# Patient Record
Sex: Female | Born: 1995 | Race: Black or African American | Hispanic: No | Marital: Single | Smoking: Never smoker
Health system: Southern US, Community
[De-identification: ages and names within clinical notes are randomized; demographics above are authoritative.]

## PROBLEM LIST (undated history)

## (undated) DIAGNOSIS — G43909 Migraine, unspecified, not intractable, without status migrainosus: Secondary | ICD-10-CM

---
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
## 2019-04-28 ENCOUNTER — Ambulatory Visit (HOSPITAL_COMMUNITY)
Admission: EM | Admit: 2019-04-28 | Discharge: 2019-04-28 | Disposition: A | Discharge status: Home / Self Care | Payer: HRSA Program | Attending: Family Medicine | Admitting: Family Medicine

## 2019-04-28 ENCOUNTER — Encounter (HOSPITAL_COMMUNITY): Payer: Self-pay

## 2019-04-28 ENCOUNTER — Other Ambulatory Visit: Payer: Self-pay

## 2019-04-28 DIAGNOSIS — R112 Nausea with vomiting, unspecified: Secondary | ICD-10-CM | POA: Diagnosis present

## 2019-04-28 DIAGNOSIS — Z20822 Contact with and (suspected) exposure to covid-19: Secondary | ICD-10-CM | POA: Insufficient documentation

## 2019-04-28 DIAGNOSIS — A084 Viral intestinal infection, unspecified: Secondary | ICD-10-CM | POA: Insufficient documentation

## 2019-04-28 MED ORDER — ONDANSETRON HCL 4 MG PO TABS: 4.0000 mg | ORAL_TABLET | Freq: Four times a day (QID) | ORAL | 0 refills | Status: DC

## 2019-04-28 MED ORDER — ONDANSETRON 4 MG PO TBDP
ORAL_TABLET | ORAL | Status: AC
  Filled 2019-04-28: qty 2

## 2019-04-28 MED ORDER — ONDANSETRON 4 MG PO TBDP
8.0000 mg | ORAL_TABLET | Freq: Once | ORAL | Status: AC
  Administered 2019-04-28: 8 mg via ORAL

## 2019-04-28 NOTE — ED Triage Notes (Signed)
Pt states woke up at 0600 this morning and had diarrhea and vomiting. Pt states had 8 episodes of emesis and diarrhea 5 episodes today.

## 2019-04-28 NOTE — Discharge Instructions (Signed)
Take the zofran as needed Clear liquids at first then advance to solid bland foods May take imodium or peptobismol if needed for diarrhea Should improve in a couple of days

## 2019-04-28 NOTE — ED Provider Notes (Signed)
[facility]    CSN: [id number] Arrival date & time: 04/28/19  1230      History   Chief Complaint Chief Complaint  Patient presents with  . Emesis    HPI Debbie Yoder is a 24 y.o. female.   HPI  Nausea vomiting diarrhea starting this morning Is not able to control her vomiting.  Cannot keep down even water Crampy abdominal pain that comes and goes.  Has cramps every time she has diarrhea Watery diarrhea with no blood or mucus  no recent travel no known exposure to illness h No lactose intolerance or food intolerance known [place] on campus, exposed to other kids in college  History reviewed. No pertinent past medical history.  There are no problems to display for this patient.   History reviewed. No pertinent surgical history.  OB History   No obstetric history on file.      Home Medications    Prior to Admission medications   Medication Sig Start Date End Date Taking? Authorizing Provider  ondansetron (ZOFRAN) 4 MG tablet Take 1-2 tablets (4-8 mg total) by mouth every 6 (six) hours. 04/28/19   Raylene Everts, MD    Family History Family History  Problem Relation Age of Onset  . Hypertension Mother     Social History Social History   Tobacco Use  . Smoking status: Never Smoker  . Smokeless tobacco: Never Used  Substance Use Topics  . Alcohol use: Yes    Comment: occ  . Drug use: Never     Allergies   Patient has no known allergies.   Review of Systems Review of Systems  Constitutional: Positive for chills and fever.  Gastrointestinal: Positive for abdominal pain, diarrhea, nausea and vomiting.  Neurological: Negative for headaches.     Physical Exam Triage Vital Signs ED Triage Vitals  Enc Vitals Group     BP 04/28/19 1401 131/75     Pulse Rate 04/28/19 1401 (!) 103     Resp 04/28/19 1401 18     Temp 04/28/19 1401 98.1 F (36.7 C)     Temp Source 04/28/19 1401 Oral     SpO2 04/28/19 1401 99 %     Weight 04/28/19 1402  180 lb (81.6 kg)     Height 04/28/19 1402 4\' 11"  (1.499 m)     Head Circumference --      Peak Flow --      Pain Score 04/28/19 1402 7     Pain Loc --      Pain Edu? --      Excl. in [place]? --    No data found.  Updated Vital Signs BP 131/75   Pulse (!) 103   Temp 98.1 F (36.7 C) (Oral)   Resp 18   Ht 4\' 11"  (1.499 m)   Wt 81.6 kg   SpO2 99%   BMI 36.36 kg/m      Physical Exam Constitutional:      General: She is not in acute distress.    Appearance: She is well-developed.     Comments: overweight  HENT:     Head: Normocephalic and atraumatic.     Mouth/Throat:     Mouth: Mucous membranes are moist.     Comments: mask Eyes:     Conjunctiva/sclera: Conjunctivae normal.     Pupils: Pupils are equal, round, and reactive to light.  Cardiovascular:     Rate and Rhythm: Normal rate and regular rhythm.  Pulmonary:  Effort: Pulmonary effort is normal. No respiratory distress.     Breath sounds: Normal breath sounds.  Abdominal:     General: There is no distension.     Palpations: Abdomen is soft.     Tenderness: There is no abdominal tenderness.     Comments: Active BS  Musculoskeletal:        General: Normal range of motion.     Cervical back: Normal range of motion.  Lymphadenopathy:     Cervical: No cervical adenopathy.  Skin:    General: Skin is warm and dry.  Neurological:     Mental Status: She is alert.  Psychiatric:        Mood and Affect: Mood normal.        Behavior: Behavior normal.      UC Treatments / Results  Labs (all labs ordered are listed, but only abnormal results are displayed) Labs Reviewed  SARS CORONAVIRUS 2 (TAT 6-24 HRS)    EKG   Radiology No results found.  Procedures Procedures (including critical care time)  Medications Ordered in UC Medications  ondansetron (ZOFRAN-ODT) disintegrating tablet 8 mg (8 mg Oral Given 04/28/19 1438)    Initial Impression / Assessment and Plan / UC Course  I have reviewed the triage  vital signs and the nursing notes.  Pertinent labs & imaging results that were available during my care of the patient were reviewed by me and considered in my medical decision making (see chart for details).     After Zofran I went in to check patient.  She was asleep on the table.  She was discharged home with instructions regarding prevention of dehydration, advancement of diet, home treatment of gastroenteritis Final Clinical Impressions(s) / UC Diagnoses   Final diagnoses:  Viral gastroenteritis     Discharge Instructions     Take the zofran as needed Clear liquids at first then advance to solid bland foods May take imodium or peptobismol if needed for diarrhea Should improve in a couple of days    ED Prescriptions    Medication Sig Dispense Auth. Provider   ondansetron (ZOFRAN) 4 MG tablet Take 1-2 tablets (4-8 mg total) by mouth every 6 (six) hours. 12 tablet Eustace Moore, MD     PDMP not reviewed this encounter.   Eustace Moore, MD 04/28/19 [phone]

## 2019-04-29 LAB — SARS CORONAVIRUS 2 (TAT 6-24 HRS): SARS Coronavirus 2: NEGATIVE

## 2020-01-09 ENCOUNTER — Encounter (HOSPITAL_COMMUNITY): Payer: Self-pay | Admitting: Emergency Medicine

## 2020-01-09 ENCOUNTER — Other Ambulatory Visit: Payer: Self-pay

## 2020-01-09 ENCOUNTER — Ambulatory Visit (HOSPITAL_COMMUNITY)
Admission: EM | Admit: 2020-01-09 | Discharge: 2020-01-09 | Disposition: A | Discharge status: Home / Self Care | Payer: 59 | Attending: Emergency Medicine | Admitting: Emergency Medicine

## 2020-01-09 DIAGNOSIS — J069 Acute upper respiratory infection, unspecified: Secondary | ICD-10-CM | POA: Insufficient documentation

## 2020-01-09 DIAGNOSIS — U071 COVID-19: Secondary | ICD-10-CM | POA: Diagnosis not present

## 2020-01-09 HISTORY — DX: Migraine, unspecified, not intractable, without status migrainosus: G43.909

## 2020-01-09 LAB — RESP PANEL BY RT-PCR (FLU A&B, COVID) ARPGX2
Influenza A by PCR: NEGATIVE
Influenza B by PCR: NEGATIVE
SARS Coronavirus 2 by RT PCR: POSITIVE — AB

## 2020-01-09 MED ORDER — PROMETHAZINE-DM 6.25-15 MG/5ML PO SYRP: 5.0000 mL | ORAL_SOLUTION | Freq: Four times a day (QID) | ORAL | 0 refills | Status: DC | PRN

## 2020-01-09 MED ORDER — BENZONATATE 100 MG PO CAPS: 200.0000 mg | ORAL_CAPSULE | Freq: Three times a day (TID) | ORAL | 0 refills | Status: DC | PRN

## 2020-01-09 NOTE — Discharge Instructions (Addendum)
Isolate at home to the results of your Covid test are back.  If your Covid positive you will need to quarantine for 10 days from the start of your symptoms.  After 10 days you can break quarantine if your symptoms have improved and you are not running a fever in 24 hours.  Use the Tessalon Perles every 8 hours as needed for cough.  Take them with a small sip of water.  They may give you a metallic taste in your mouth or some numbness the patient returned that is normal. Inability to speak full sentences, or blowing around her lips you to go to the ER for evaluation. Use the Promethazine DM at bedtime for cough, congestion, nausea, and sleep.  If you develop worsening shortness of breath, especially shortness of breath at rest,

## 2020-01-09 NOTE — ED Triage Notes (Signed)
Pt complains of headache, stomach ache, vomiting and fever x 3 days. She has taken Acetaminophen and Dayquil today with minimal relief. Vomited x 1 within 24 hours. No diarrhea.

## 2020-01-09 NOTE — ED Provider Notes (Signed)
[facility]    CSN: [id number] Arrival date & time: 01/09/20  1817      History   Chief Complaint No chief complaint on file.   HPI Debbie Yoder is a 24 y.o. female.   HPI   7 old female here for evaluation of headache, fever, abdominal pain, vomiting x1.  Her symptoms have been present for last 3 days.  She states that she is also had a runny nose, sore throat, dry cough, mild which of breath, low abdominal pain.  Patient denies ear pain or pressure, sinus pain or pressure, wheezing, or diarrhea.  Patient is unaware of any sick contacts but she is also not been vaccinated against flu or Covid.  Past Medical History:  Diagnosis Date  . Migraine     There are no problems to display for this patient.   History reviewed. No pertinent surgical history.  OB History   No obstetric history on file.      Home Medications    Prior to Admission medications   Medication Sig Start Date End Date Taking? Authorizing Provider  benzonatate (TESSALON) 100 MG capsule Take 2 capsules (200 mg total) by mouth 3 (three) times daily as needed for cough. 01/09/20   Becky Augusta, NP  promethazine-dextromethorphan (PROMETHAZINE-DM) 6.25-15 MG/5ML syrup Take 5 mLs by mouth 4 (four) times daily as needed for cough. 01/09/20   Becky Augusta, NP    Family History Family History  Problem Relation Age of Onset  . Hypertension Mother     Social History Social History   Tobacco Use  . Smoking status: Never Smoker  . Smokeless tobacco: Never Used  Vaping Use  . Vaping Use: Never used  Substance Use Topics  . Alcohol use: Yes    Comment: occ  . Drug use: Never     Allergies   Patient has no known allergies.   Review of Systems Review of Systems  Constitutional: Positive for appetite change and fever. Negative for activity change.  HENT: Positive for congestion and sore throat. Negative for ear pain, rhinorrhea, sinus pressure and sinus pain.   Respiratory:  Positive for cough and shortness of breath. Negative for wheezing.   Cardiovascular: Negative for chest pain.  Gastrointestinal: Positive for abdominal pain, nausea and vomiting. Negative for constipation and diarrhea.  Musculoskeletal: Negative for arthralgias and myalgias.  Skin: Negative for rash.  Neurological: Positive for headaches. Negative for syncope.  Hematological: Negative.      Physical Exam Triage Vital Signs ED Triage Vitals [01/09/20 1909]  Enc Vitals Group     BP      Pulse      Resp      Temp      Temp src      SpO2      Weight      Height      Head Circumference      Peak Flow      Pain Score 0     Pain Loc      Pain Edu?      Excl. in [place]?    No data found.  Updated Vital Signs LMP 12/30/2019   Visual Acuity Right Eye Distance:   Left Eye Distance:   Bilateral Distance:    Right Eye Near:   Left Eye Near:    Bilateral Near:     Physical Exam Vitals and nursing note reviewed.  Constitutional:      General: She is not in acute distress.  Appearance: Normal appearance. She is normal weight. She is ill-appearing.  HENT:     Head: Normocephalic and atraumatic.     Right Ear: Tympanic membrane, ear canal and external ear normal. There is no impacted cerumen.     Left Ear: Tympanic membrane, ear canal and external ear normal. There is no impacted cerumen.     Nose: Congestion and rhinorrhea present.     Mouth/Throat:     Mouth: Mucous membranes are moist.     Pharynx: Oropharynx is clear. No oropharyngeal exudate or posterior oropharyngeal erythema.  Cardiovascular:     Rate and Rhythm: Normal rate and regular rhythm.     Pulses: Normal pulses.     Heart sounds: Normal heart sounds. No murmur heard. No gallop.   Pulmonary:     Effort: Pulmonary effort is normal.     Breath sounds: Normal breath sounds. No wheezing, rhonchi or rales.  Abdominal:     General: Bowel sounds are normal.     Palpations: Abdomen is soft.     Tenderness: There  is abdominal tenderness. There is no guarding or rebound.  Musculoskeletal:     Cervical back: Normal range of motion and neck supple.  Lymphadenopathy:     Cervical: No cervical adenopathy.  Skin:    General: Skin is warm and dry.     Capillary Refill: Capillary refill takes less than 2 seconds.     Findings: No erythema or rash.  Neurological:     General: No focal deficit present.     Mental Status: She is alert and oriented to person, place, and time.  Psychiatric:        Mood and Affect: Mood normal.        Behavior: Behavior normal.        Thought Content: Thought content normal.        Judgment: Judgment normal.      UC Treatments / Results  Labs (all labs ordered are listed, but only abnormal results are displayed) Labs Reviewed  RESP PANEL BY RT-PCR (FLU A&B, COVID) [id number]    EKG   Radiology No results found.  Procedures Procedures (including critical care time)  Medications Ordered in UC Medications - No data to display  Initial Impression / Assessment and Plan / UC Course  I have reviewed the triage vital signs and the nursing notes.  Pertinent labs & imaging results that were available during my care of the patient were reviewed by me and considered in my medical decision making (see chart for details).   Is here for evaluation of headache, fever, generalized abdominal pain, runny nose, sore throat, and one episode of vomiting.  Physical exam reveals marked erythema and edema of nasal mucosa with clear nasal discharge.  Posterior oropharynx is unremarkable.  Lungs are clear to auscultation.  Patient has some mild left lower quadrant tenderness without guarding.  Lungs are clear to auscultation.  Patient has not been vaccinated as Covid no or of the flu.  Will send respiratory triplex panel and discharge patient home to isolate until the Covid results are back.  Will give Tessalon Perles and Promethazine DM for cough and congestion.   Final Clinical  Impressions(s) / UC Diagnoses   Final diagnoses:  Acute upper respiratory infection     Discharge Instructions     Isolate at home to the results of your Covid test are back.  If your Covid positive you will need to quarantine for 10 days from the start of  your symptoms.  After 10 days you can break quarantine if your symptoms have improved and you are not running a fever in 24 hours.  Use the Tessalon Perles every 8 hours as needed for cough.  Take them with a small sip of water.  They may give you a metallic taste in your mouth or some numbness the patient returned that is normal. Inability to speak full sentences, or blowing around her lips you to go to the ER for evaluation. Use the Promethazine DM at bedtime for cough, congestion, nausea, and sleep.  If you develop worsening shortness of breath, especially shortness of breath at rest,    ED Prescriptions    Medication Sig Dispense Auth. Provider   benzonatate (TESSALON) 100 MG capsule Take 2 capsules (200 mg total) by mouth 3 (three) times daily as needed for cough. 21 capsule Becky Augusta, NP   promethazine-dextromethorphan (PROMETHAZINE-DM) 6.25-15 MG/5ML syrup Take 5 mLs by mouth 4 (four) times daily as needed for cough. 118 mL Becky Augusta, NP     PDMP not reviewed this encounter.   Becky Augusta, NP 01/09/20 1956

## 2020-01-11 ENCOUNTER — Emergency Department (HOSPITAL_COMMUNITY)
Admission: EM | Admit: 2020-01-11 | Discharge: 2020-01-11 | Disposition: A | Discharge status: Home / Self Care | Payer: 59 | Attending: Emergency Medicine | Admitting: Emergency Medicine

## 2020-01-11 ENCOUNTER — Other Ambulatory Visit: Payer: Self-pay

## 2020-01-11 ENCOUNTER — Telehealth: Payer: Self-pay | Admitting: Physician Assistant

## 2020-01-11 ENCOUNTER — Emergency Department (HOSPITAL_COMMUNITY): Payer: 59

## 2020-01-11 ENCOUNTER — Encounter (HOSPITAL_COMMUNITY): Payer: Self-pay | Admitting: Emergency Medicine

## 2020-01-11 ENCOUNTER — Encounter: Payer: Self-pay | Admitting: Oncology

## 2020-01-11 ENCOUNTER — Other Ambulatory Visit: Payer: Self-pay | Admitting: Oncology

## 2020-01-11 DIAGNOSIS — R070 Pain in throat: Secondary | ICD-10-CM | POA: Diagnosis present

## 2020-01-11 DIAGNOSIS — U071 COVID-19: Secondary | ICD-10-CM | POA: Diagnosis not present

## 2020-01-11 LAB — BASIC METABOLIC PANEL
Anion gap: 11 (ref 5–15)
BUN: 5 mg/dL — ABNORMAL LOW (ref 6–20)
CO2: 23 mmol/L (ref 22–32)
Calcium: 8.7 mg/dL — ABNORMAL LOW (ref 8.9–10.3)
Chloride: 101 mmol/L (ref 98–111)
Creatinine, Ser: 0.78 mg/dL (ref 0.44–1.00)
GFR, Estimated: >60 mL/min (ref >60)
Glucose, Bld: 113 mg/dL — ABNORMAL HIGH (ref 70–99)
Potassium: 3.3 mmol/L — ABNORMAL LOW (ref 3.5–5.1)
Sodium: 135 mmol/L (ref 135–145)

## 2020-01-11 LAB — CBC
HCT: 41.4 % (ref 36.0–46.0)
Hemoglobin: 13 g/dL (ref 12.0–15.0)
MCH: 25.1 pg — ABNORMAL LOW (ref 26.0–34.0)
MCHC: 31.4 g/dL (ref 30.0–36.0)
MCV: 79.9 fL — ABNORMAL LOW (ref 80.0–100.0)
Platelets: 256 10*3/uL (ref 150–400)
RBC: 5.18 MIL/uL — ABNORMAL HIGH (ref 3.87–5.11)
RDW: 13.4 % (ref 11.5–15.5)
WBC: 3.5 10*3/uL — ABNORMAL LOW (ref 4.0–10.5)
nRBC: 0 % (ref 0.0–0.2)

## 2020-01-11 LAB — I-STAT BETA HCG BLOOD, ED (MC, WL, AP ONLY): I-stat hCG, quantitative: <5 m[IU]/mL (ref <5)

## 2020-01-11 MED ORDER — HYDROCOD POLST-CPM POLST ER 10-8 MG/5ML PO SUER
5.0000 mL | Freq: Once | ORAL | Status: AC
  Administered 2020-01-11: 5 mL via ORAL
  Filled 2020-01-11: qty 5

## 2020-01-11 MED ORDER — ONDANSETRON 4 MG PO TBDP
4.0000 mg | ORAL_TABLET | Freq: Once | ORAL | Status: AC
  Administered 2020-01-11: 4 mg via ORAL
  Filled 2020-01-11: qty 1

## 2020-01-11 MED ORDER — OXYCODONE-ACETAMINOPHEN 5-325 MG PO TABS
1.0000 | ORAL_TABLET | ORAL | Status: DC | PRN
  Administered 2020-01-11: 1 via ORAL
  Filled 2020-01-11: qty 1

## 2020-01-11 NOTE — ED Notes (Signed)
Pt ambulated in the room. Pt denies any sob while ambulating. Pt sp02 maintain 96-97% during ambulation.

## 2020-01-11 NOTE — ED Triage Notes (Addendum)
Pt presents to ED POV. Pt c/o n/v, headache, cough. Pt reports that she tested covid+ on 12/17. Pt not vaccinated for covid. C/o mild exertional dyspnea.

## 2020-01-11 NOTE — Discharge Instructions (Addendum)
Thank you for allowing [place] to care for you today.   Please return to the emergency department if you have any new or worsening symptoms.   Medications-  Take the cough medications prescribed by urgent care. You can take medications to help treat your symptoms: -Tylenol for fever and body aches. Please take as prescribed on the bottle. -Over the coutner cough medicine such as mucinex, robitussin, or other brands. -Flonase or saline nasal spray for nasal congestion -Vitamins as recommended by CDC  Treatment- This is a virus and unfortunately there are no antibitotics approved to treat this virus at this time. It is important to monitor your symptoms closely: -You should have a theremometer at home to check your temperature when feeling feverish. -Use a pulse ox meter to measure your oxygen when feeling short of breath.  -If your fever is over 100.4 despite taking tylenol or if your oxygen level drops below 94% these are reasons to return to the emergency department for further evaluation.   -You need to quarantine for 10 days starting today.  You can return to work, school or normal activities if on day 10 you are fever free without the use of Tylenol or ibuprofen.  You will need to continue quarantine if you still have a fever over 100.4.  Again: symptoms of shortness of breath, chest pain, difficulty breathing, new onset of confusion, any symptoms that are concerning. If any of these symptoms you should come to emergency department for evaluation.   I hope you feel better soon

## 2020-01-11 NOTE — Telephone Encounter (Signed)
Called to Discuss with patient about Covid symptoms and the use of the monoclonal antibody infusion for those with mild to moderate Covid symptoms and at a high risk of hospitalization.     Pt appears to qualify for this infusion due to co-morbid conditions and/or a member of an at-risk group in accordance with the FDA Emergency Use Authorization.    Unable to reach pt - left VM and sent MyChart.   Sx started 12/14. She has had 2 ER visits. Qualifies with BMI and SVI of 2.

## 2020-01-11 NOTE — Progress Notes (Signed)
I connected by phone with  Debbie Yoder to discuss the potential use of an new treatment for mild to moderate COVID-19 viral infection in non-hospitalized patients.   This patient is a age/sex that meets the FDA criteria for Emergency Use Authorization of casirivimab\imdevimab.  Has a (+) direct SARS-CoV-2 viral test result 1. Has mild or moderate COVID-19  2. Is ? 22 years of age and weighs ? 40 kg 3. Is NOT hospitalized due to COVID-19 4. Is NOT requiring oxygen therapy or requiring an increase in baseline oxygen flow rate due to COVID-19 5. Is within 10 days of symptom onset 6. Has at least one of the high risk factor(s) for progression to severe COVID-19 and/or hospitalization as defined in EUA. Specific high risk criteria : Past Medical History:  Diagnosis Date  . Migraine   ?  ? Obesity    Symptom onset  01/05/20   I have spoken and communicated the following to the patient or parent/caregiver:   1. FDA has authorized the emergency use of casirivimab\imdevimab for the treatment of mild to moderate COVID-19 in adults and pediatric patients with positive results of direct SARS-CoV-2 viral testing who are 63 years of age and older weighing at least 40 kg, and who are at high risk for progressing to severe COVID-19 and/or hospitalization.   2. The significant known and potential risks and benefits of casirivimab\imdevimab, and the extent to which such potential risks and benefits are unknown.   3. Information on available alternative treatments and the risks and benefits of those alternatives, including clinical trials.   4. Patients treated with casirivimab\imdevimab should continue to self-isolate and use infection control measures (e.g., wear mask, isolate, social distance, avoid sharing personal items, clean and disinfect "high touch" surfaces, and frequent handwashing) according to CDC guidelines.    5. The patient or parent/caregiver has the option to accept or refuse  casirivimab\imdevimab .   After reviewing this information with the patient, The patient agreed to proceed with receiving casirivimab\imdevimab infusion and will be provided a copy of the Fact sheet prior to receiving the infusion.Mignon Pine, AGNP-C [phone] (Infusion Center Hotline)

## 2020-01-11 NOTE — ED Provider Notes (Signed)
[facility] EMERGENCY DEPARTMENT Provider Note   CSN: [id number] Arrival date & time: 01/11/20  0132     History Chief Complaint  Patient presents with  . Covid Positive    Debbie Yoder is a 24 y.o. female with past medical history significant for migraine. Did not have covid vaccinations.  HPI Patient presents to emergency department today with chief complaint of covid positive. Patient tested positive for covid x 2 days go at urgent care. Her symptoms include rhinorrhea, sore throat, non productive cough, headache, nausea and vomiting. She is on day 6 of symptoms. She states her headache is a throbbing sensation all around her head and has progressively worsened since onset. She had 2 episodes of post tussive emesis yesterday. She was prescribed tessalon and promethazine-DM at [facility] visit. She has not yet picked them up from the pharmacy. She states her mother is picking them today for her. She has been taking Dayquil and Nyquil for her symptoms with minimal symptom improvement. She is able to tolerate PO intake without difficulty although endorses decreased appetite. Denies fever, syncope, head trauma, photophobia, phonophobia, UL throbbing, N/V, visual changes, stiff neck, neck pain, rash, or "thunderclap" onset.  Patient was given a percocet and zofran in triage and now reports headache has resolved. Her     Past Medical History:  Diagnosis Date  . Migraine     There are no problems to display for this patient.   History reviewed. No pertinent surgical history.   OB History   No obstetric history on file.     Family History  Problem Relation Age of Onset  . Hypertension Mother     Social History   Tobacco Use  . Smoking status: Never Smoker  . Smokeless tobacco: Never Used  Vaping Use  . Vaping Use: Never used  Substance Use Topics  . Alcohol use: Yes    Comment: occ  . Drug use: Never    Home Medications Prior to Admission medications    Medication Sig Start Date End Date Taking? Authorizing Provider  benzonatate (TESSALON) 100 MG capsule Take 2 capsules (200 mg total) by mouth 3 (three) times daily as needed for cough. 01/09/20   Becky Augusta, NP  promethazine-dextromethorphan (PROMETHAZINE-DM) 6.25-15 MG/5ML syrup Take 5 mLs by mouth 4 (four) times daily as needed for cough. 01/09/20   Becky Augusta, NP    Allergies    Patient has no known allergies.  Review of Systems   Review of Systems All other systems are reviewed and are negative for acute change except as noted in the HPI.  Physical Exam Updated Vital Signs BP 109/62 (BP Location: Right Arm)   Pulse 93   Temp 98.2 F (36.8 C) (Oral)   Resp 20   LMP 12/30/2019   SpO2 99%   Physical Exam Vitals and nursing note reviewed.  Constitutional:      General: She is not in acute distress.    Appearance: She is not ill-appearing.     Comments: Dry hacking cough during exam  HENT:     Head: Normocephalic and atraumatic.     Comments: No sinus or temporal tenderness.    Right Ear: Tympanic membrane and external ear normal.     Left Ear: Tympanic membrane and external ear normal.     Nose: Nose normal.     Mouth/Throat:     Mouth: Mucous membranes are moist.     Pharynx: Oropharynx is clear.  Eyes:  General: No scleral icterus.       Right eye: No discharge.        Left eye: No discharge.     Extraocular Movements: Extraocular movements intact.     Conjunctiva/sclera: Conjunctivae normal.     Pupils: Pupils are equal, round, and reactive to light.  Neck:     Vascular: No JVD.     Comments: Full ROM intact without spinous process TTP. No bony stepoffs or deformities, no paraspinous muscle TTP or muscle spasms. No rigidity or meningeal signs. No bruising, erythema, or swelling.  Cardiovascular:     Rate and Rhythm: Normal rate and regular rhythm.     Pulses: Normal pulses.          Radial pulses are 2+ on the right side and 2+ on the left side.      Heart sounds: Normal heart sounds.  Pulmonary:     Comments: Lungs clear to auscultation in all fields. Symmetric chest rise. No wheezing, rales, or rhonchi. Oxygen saturation is 96% on room air. Speaking in full sentences Abdominal:     Comments: Abdomen is soft, non-distended, and non-tender in all quadrants. No rigidity, no guarding. No peritoneal signs.  Musculoskeletal:        General: Normal range of motion.     Cervical back: Normal range of motion.  Skin:    General: Skin is warm and dry.     Capillary Refill: Capillary refill takes less than 2 seconds.  Neurological:     Mental Status: She is oriented to person, place, and time.     GCS: GCS eye subscore is 4. GCS verbal subscore is 5. GCS motor subscore is 6.     Comments: Speech is clear and goal oriented, follows commands CN III-XII intact, no facial droop Normal strength in upper and lower extremities bilaterally including dorsiflexion and plantar flexion, strong and equal grip strength Sensation normal to light and sharp touch Moves extremities without ataxia, coordination intact Normal finger to nose and rapid alternating movements Normal gait and balance   Psychiatric:        Behavior: Behavior normal.     ED Results / Procedures / Treatments   Labs (all labs ordered are listed, but only abnormal results are displayed) Labs Reviewed  CBC - Abnormal; Notable for the following components:      Result Value   WBC 3.5 (*)    RBC 5.18 (*)    MCV 79.9 (*)    MCH 25.1 (*)    All other components within normal limits  BASIC METABOLIC PANEL - Abnormal; Notable for the following components:   Potassium 3.3 (*)    Glucose, Bld 113 (*)    BUN 5 (*)    Calcium 8.7 (*)    All other components within normal limits  I-STAT BETA HCG BLOOD, ED ([facility], [facility], [facility] ONLY)    EKG None  Radiology DG Chest Portable 1 View  Result Date: 01/11/2020 CLINICAL DATA:  Shortness of breath EXAM: PORTABLE CHEST 1 VIEW COMPARISON:  None.  FINDINGS: The heart size and mediastinal contours are within normal limits. Both lungs are clear. The visualized skeletal structures are unremarkable. IMPRESSION: No active disease. Electronically Signed   By: Deatra Robinson M.D.   On: 01/11/2020 02:38    Procedures Procedures (including critical care time)  Medications Ordered in ED Medications  oxyCODONE-acetaminophen (PERCOCET/ROXICET) 5-325 MG per tablet 1 tablet (1 tablet Oral Given 01/11/20 0154)  ondansetron (ZOFRAN-ODT) disintegrating tablet 4 mg (  4 mg Oral Given 01/11/20 0154)  chlorpheniramine-HYDROcodone (TUSSIONEX) 10-8 MG/5ML suspension 5 mL (5 mLs Oral Given 01/11/20 0740)    ED Course  I have reviewed the triage vital signs and the nursing notes.  Pertinent labs & imaging results that were available during my care of the patient were reviewed by me and considered in my medical decision making (see chart for details).    MDM Rules/Calculators/A&P                           History provided by patient with additional history obtained from chart review.    Exam is benign.  Normal WOB. No fever, tachypnea, tachycardia, hypoxemia. Lungs are CTAB. Work up was initiated in triage. I viewed pt's chest xray and it does not suggest acute infectious processes. Labs with leukopenia WBC 3.5, mild hypokalemia 3.3, otherwise overall unremarkable. Pregnancy test negative.She was also presenting with headache and given percocet and zofran in triage. By the time of my exam headache has resolved. She has normal neuro exam. Nothing to suggest life threatening cause of headache such ash head bleed, TIA, CVA.  No significant h/o immunocompromise.  No signs or symptoms to suggest strep pharyngitis.  No clinical signs of severe illness, dehydration, to warrant further emergent work up in ER. Given dose of cough medicine here. Patient ambulated in the emergency department without respiratory distress or hypoxia, SpO2 >95% on room air. Patient already  has prescriptions for cough medicine prescribed by UC. Advised her to take those and continue OTC medications. The patient appears reasonably screened and/or stabilized for discharge and I doubt any other medical condition or other [facility] requiring further screening, evaluation, or treatment in the ED at this time prior to discharge. The patient is safe for discharge with strict return precautions discussed. referral sent to [facility] infusion [facility], she meets criteria based on BMI.  Cammi Consalvo was evaluated in Emergency Department on 01/11/2020 for the symptoms described in the history of present illness. She was evaluated in the context of the global COVID-19 pandemic, which necessitated consideration that the patient might be at risk for infection with the SARS-CoV-2 virus that causes COVID-19. Institutional protocols and algorithms that pertain to the evaluation of patients at risk for COVID-19 are in a state of rapid change based on information released by regulatory bodies including the CDC and federal and state organizations. These policies and algorithms were followed during the patient's care in the ED.   Portions of this note were generated with Scientist, clinical (histocompatibility and immunogenetics). Dictation errors may occur despite best attempts at proofreading.  Final Clinical Impression(s) / ED Diagnoses Final diagnoses:  COVID    Rx / DC Orders ED Discharge Orders    None       Kandice Hams 01/11/20 0804    Jacalyn Lefevre, MD 01/11/20 [phone]

## 2020-01-12 ENCOUNTER — Ambulatory Visit (HOSPITAL_COMMUNITY)
Admission: RE | Admit: 2020-01-12 | Discharge: 2020-01-12 | Disposition: A | Discharge status: Home / Self Care | Payer: 59 | Source: Ambulatory Visit | Attending: Pulmonary Disease | Admitting: Pulmonary Disease

## 2020-01-12 ENCOUNTER — Ambulatory Visit (HOSPITAL_COMMUNITY): Payer: 59

## 2020-01-12 DIAGNOSIS — U071 COVID-19: Secondary | ICD-10-CM | POA: Diagnosis not present

## 2020-01-12 MED ORDER — SODIUM CHLORIDE 0.9 % IV SOLN: INTRAVENOUS | Status: DC | PRN

## 2020-01-12 MED ORDER — SODIUM CHLORIDE 0.9 % IV SOLN: Freq: Once | INTRAVENOUS | Status: AC

## 2020-01-12 MED ORDER — METHYLPREDNISOLONE SODIUM SUCC 125 MG IJ SOLR: 125.0000 mg | Freq: Once | INTRAMUSCULAR | Status: DC | PRN

## 2020-01-12 MED ORDER — ONDANSETRON HCL 4 MG/2ML IJ SOLN
4.0000 mg | Freq: Once | INTRAMUSCULAR | Status: AC
  Administered 2020-01-12: 4 mg via INTRAVENOUS
  Filled 2020-01-12: qty 2

## 2020-01-12 MED ORDER — FAMOTIDINE IN NACL 20-0.9 MG/50ML-% IV SOLN: 20.0000 mg | Freq: Once | INTRAVENOUS | Status: DC | PRN

## 2020-01-12 MED ORDER — ALBUTEROL SULFATE HFA 108 (90 BASE) MCG/ACT IN AERS: 2.0000 | INHALATION_SPRAY | Freq: Once | RESPIRATORY_TRACT | Status: DC | PRN

## 2020-01-12 MED ORDER — DIPHENHYDRAMINE HCL 50 MG/ML IJ SOLN: 50.0000 mg | Freq: Once | INTRAMUSCULAR | Status: DC | PRN

## 2020-01-12 MED ORDER — EPINEPHRINE 0.3 MG/0.3ML IJ SOAJ: 0.3000 mg | Freq: Once | INTRAMUSCULAR | Status: DC | PRN

## 2020-01-12 NOTE — Discharge Instructions (Signed)
10 Things You Can Do to Manage Your COVID-19 Symptoms at Home If you have possible or confirmed COVID-19: 1. Stay home from work and school. And stay away from other public places. If you must go out, avoid using any kind of public transportation, ridesharing, or taxis. 2. Monitor your symptoms carefully. If your symptoms get worse, call your healthcare provider immediately. 3. Get rest and stay hydrated. 4. If you have a medical appointment, call the healthcare provider ahead of time and tell them that you have or may have COVID-19. 5. For medical emergencies, call 911 and notify the dispatch personnel that you have or may have COVID-19. 6. Cover your cough and sneezes with a tissue or use the inside of your elbow. 7. Wash your hands often with soap and water for at least 20 seconds or clean your hands with an alcohol-based hand sanitizer that contains at least 60% alcohol. 8. As much as possible, stay in a specific room and away from other people in your home. Also, you should use a separate bathroom, if available. If you need to be around other people in or outside of the home, wear a mask. 9. Avoid sharing personal items with other people in your household, like dishes, towels, and bedding. 10. Clean all surfaces that are touched often, like counters, tabletops, and doorknobs. Use household cleaning sprays or wipes according to the label instructions. cdc.gov/coronavirus 07/24/2018 This information is not intended to replace advice given to you by your health care provider. Make sure you discuss any questions you have with your health care provider. Document Revised: 12/26/2018 Document Reviewed: 12/26/2018 Elsevier Patient Education  2020 Elsevier Inc. What types of side effects do monoclonal antibody drugs cause?  Common side effects  In general, the more common side effects caused by monoclonal antibody drugs include: . Allergic reactions, such as hives or itching . Flu-like signs and  symptoms, including chills, fatigue, fever, and muscle aches and pains . Nausea, vomiting . Diarrhea . Skin rashes . Low blood pressure   The CDC is recommending patients who receive monoclonal antibody treatments wait at least 90 days before being vaccinated.  Currently, there are no data on the safety and efficacy of mRNA COVID-19 vaccines in persons who received monoclonal antibodies or convalescent plasma as part of COVID-19 treatment. Based on the estimated half-life of such therapies as well as evidence suggesting that reinfection is uncommon in the 90 days after initial infection, vaccination should be deferred for at least 90 days, as a precautionary measure until additional information becomes available, to avoid interference of the antibody treatment with vaccine-induced immune responses. If you have any questions or concerns after the infusion please call the Advanced Practice Provider on call at [phone]. This number is ONLY intended for your use regarding questions or concerns about the infusion post-treatment side-effects.  Please do not provide this number to others for use. For return to work notes please contact your primary care provider.   If someone you know is interested in receiving treatment please have them call the COVID hotline at [phone].   

## 2020-01-12 NOTE — Progress Notes (Signed)
  Diagnosis: COVID-19  Physician: Dr. Wright   Procedure: [facility] Infusion [facility] Med: bamlanivimab\etesevimab infusion - Provided patient with bamlanimivab\etesevimab fact sheet for patients, parents and caregivers prior to infusion.  Complications: No immediate complications noted.  Discharge: Discharged home   Lauralynn Loeb  B Jiovanni Heeter 01/12/2020   

## 2020-01-12 NOTE — Progress Notes (Signed)
Patient reviewed Fact Sheet for Patients, Parents, and Caregivers for Emergency Use Authorization (EUA) of bamlanivimab and etesevimab for the Treatment of Coronavirus. Patient also reviewed and is agreeable to the estimated cost of treatment. Patient is agreeable to proceed.   

## 2020-01-13 ENCOUNTER — Ambulatory Visit (HOSPITAL_COMMUNITY): Payer: 59

## 2020-06-30 DIAGNOSIS — G43009 Migraine without aura, not intractable, without status migrainosus: Secondary | ICD-10-CM | POA: Insufficient documentation

## 2020-07-15 DIAGNOSIS — E559 Vitamin D deficiency, unspecified: Secondary | ICD-10-CM | POA: Insufficient documentation

## 2020-07-15 DIAGNOSIS — R7303 Prediabetes: Secondary | ICD-10-CM | POA: Insufficient documentation

## 2020-08-16 ENCOUNTER — Other Ambulatory Visit: Payer: Self-pay

## 2020-08-16 ENCOUNTER — Ambulatory Visit (INDEPENDENT_AMBULATORY_CARE_PROVIDER_SITE_OTHER): Payer: 59 | Admitting: Clinical

## 2020-08-16 DIAGNOSIS — F330 Major depressive disorder, recurrent, mild: Secondary | ICD-10-CM | POA: Diagnosis not present
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
## 2020-10-11 ENCOUNTER — Other Ambulatory Visit: Payer: Self-pay

## 2020-10-11 ENCOUNTER — Ambulatory Visit (INDEPENDENT_AMBULATORY_CARE_PROVIDER_SITE_OTHER): Payer: 59 | Admitting: Clinical

## 2020-10-11 DIAGNOSIS — F330 Major depressive disorder, recurrent, mild: Secondary | ICD-10-CM | POA: Diagnosis not present
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
## 2020-10-26 ENCOUNTER — Other Ambulatory Visit: Payer: Self-pay

## 2020-10-26 ENCOUNTER — Ambulatory Visit (INDEPENDENT_AMBULATORY_CARE_PROVIDER_SITE_OTHER): Payer: 59 | Admitting: Clinical

## 2020-10-26 DIAGNOSIS — F330 Major depressive disorder, recurrent, mild: Secondary | ICD-10-CM | POA: Diagnosis not present

## 2020-10-29 NOTE — Progress Notes (Signed)
   THERAPIST PROGRESS NOTE  Session Time: 35 minutes  Participation Level: Active  Behavioral Response: CasualAlertDepressed  Type of Therapy: Individual Therapy  Treatment Goals addressed: Coping  Interventions: CBT and Supportive  Summary:  Debbie Yoder is a 25 y.o. female who presents for the scheduled session oriented times five, appropriately dressed and friendly. Client denied hallucinations and delusions. Client reported since she was last seen some things have gone better. Client reported things have been better for her at work but still experiences stressors with her coworkers. Client reported she has also been working on making changes to her friends group. Client reported lately she has struggled with depression regarding her body image. Client reported she went clothe shopping with her boyfriend and started crying because of feeling uncomfortable. Client reported she makes negative comments about her appearance that her boyfriend disagrees with. Client reported the negative thoughts also stem from past experiences of what people have said to her. Client reported she engages in physical fitness activities such as boxing. Client reported otherwise she has decided to apply for dental and vet school. Client reported she is open minded to see which one may be the best fit for her.   Suicidal/Homicidal: Nowithout intent/plan  Therapist Response:  Therapist began the appointment asking how she has been doing. Therapist used CBT to utilize active listening and positive emotional support. Therapist used CBT to engage with the client to have her identify the source of her negative thoughts. Therapist assigned the client homework to buy a positive affirmations book from [employer]. Client was scheduled for next appointment.     Plan: Return again in 4 weeks.  Diagnosis: Major depressive disorder, recurrent episode, mild with anxious distress  Neena Rhymes [name], LCSW 10/26/2020

## 2020-12-13 ENCOUNTER — Ambulatory Visit (INDEPENDENT_AMBULATORY_CARE_PROVIDER_SITE_OTHER): Payer: 59 | Admitting: Clinical

## 2020-12-13 ENCOUNTER — Other Ambulatory Visit: Payer: Self-pay

## 2020-12-13 DIAGNOSIS — F330 Major depressive disorder, recurrent, mild: Secondary | ICD-10-CM | POA: Diagnosis not present

## 2020-12-15 NOTE — Progress Notes (Signed)
   THERAPIST PROGRESS NOTE  Session Time: 45 minutes  Participation Level: Active  Behavioral Response: CasualAlertEuthymic  Type of Therapy: Individual Therapy  Treatment Goals addressed: Coping  Interventions: CBT and Supportive  Summary:  Debbie Yoder is a 25 y.o. female who presents for the scheduled session oriented times five, appropriately dressed, and friendly. Client denied hallucinations and delusions. Client reported on today as she is doing well.  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  Client reported having difficult relationships with management and other coworkers.  Client reported upper management has talked gossip about her which was reported to her by the coworkers.  Client reported she also had similar situations with coworkers.  Client reported she finds herself in a situation of needing to defend herself by punching people and addressing the behaviors.  Client reported she is still considering going to dental school instead of following through veterinary school because of her difficult experiences.  Client reported likewise she has been considering making changes to certain friendships that she feels like negatively affect her.  Client reported she did follow through on the previous sessions homework to identify a journal which she uses daily that helps her to record her thoughts but also make a point to note thoughts of gratitude.   Suicidal/Homicidal: Nowithout intent/plan  Therapist Response:  Therapist began the appointment asking the client how she has been doing since last seen. Therapist used CBT to utilize active listening and positive emotional support towards her thoughts and feelings. Therapist used CBT to ask the client to identify how she has problem solved conflictual situations dealing with other people. Therapist used CBT to discuss effective communication skills to  help problem solve situations. Therapist assigned to client homework to continue recording in her journal during the week and practicing self-care activities. Client was scheduled for next appointment.     Plan: Return again in 5 weeks.  Diagnosis: MDD, recurrent episode, mild with anxious distress   Neena Rhymes Shavanna Furnari, LCSW 12/13/2020

## 2021-02-03 ENCOUNTER — Ambulatory Visit (INDEPENDENT_AMBULATORY_CARE_PROVIDER_SITE_OTHER): Payer: 59 | Admitting: Clinical

## 2021-02-03 ENCOUNTER — Other Ambulatory Visit: Payer: Self-pay

## 2021-02-03 DIAGNOSIS — F330 Major depressive disorder, recurrent, mild: Secondary | ICD-10-CM | POA: Diagnosis not present

## 2021-02-05 NOTE — Progress Notes (Signed)
THERAPIST PROGRESS NOTE  Session Time: 40 minutes  Participation Level: Active  Behavioral Response: CasualAlertEuthymic  Type of Therapy: Individual Therapy  Treatment Goals addressed: Coping  Interventions: CBT and Supportive  Summary:  Debbie Yoder is a 27 y.o. female who presents for the scheduled session oriented x5, appropriately dressed, and friendly.  Client denied hallucinations and delusions. Client reported on today that she has been maintaining fairly well since last seen.  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  Client reported that she is dissatisfied with her current place of employment because she is not receiving proper training and advancement in her practice of specialty.  Client reported she is still weighing her options to apply for dental school at [employer].  Client reported she has been implementing self-care practices of meditating more which has been beneficial for her and staying engaged in physical activity such as boxing classes.  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  Client reported she is feeling very accomplished due to learning to speak Spanish on her own through an app that she uses on her phone.  Client reported one of her stressors have been learning to understand her relationship with certain friends.  Client reported there is certain friends that make her feel unappreciated and she overextendeds to them with no reciprocity in any way from them.  Client reported it does cause her to feel sad and trying to figure out how to move forward in relation with them.  Client reported she was baptized at church last Sunday and is very happy about that.  Client reported if any charges reported to her weekly.  Client reported she is also seeing improvement with losing 3  pounds and is continuing to make choices of eating better.  Client reported that she has made a follow-up appointment with a primary care doctor due to ongoing chest pains that have been reoccurring for the past 2 to 3 months.  Client reported she is not sure what has caused it to the onset but is following up with care.  Client reported something she would like to learn is communicating better especially in her relationship with her boyfriend.  Client reported she notes that she tends to get easily upset if she does not pursue something the right way.   Suicidal/Homicidal: Nowithout intent/plan  Therapist Response:  Therapist began the appointment asking the client how she has been doing since last seen. Therapist used CBT to utilize active listening and positive emotional support. Therapist used CBT to engage and have the client identify her definition of what positive friendships through client compared to what she has been experiencing.   Therapist used CBT to prompt the client to create a pros and cons list to help her make decisions about how to improve her interpersonal relationships and how it affects her. Therapist used CBT to teach the client communication skill about recalling back information to gain clarity to help decrease irritability when confronted with a conflictual situation. Therapist assigned client homework to practice the communication skill discussed and to reinforce her positive use of meditation and physical exercise to help alleviate unwanted emotions. Client was scheduled for next appointment.     Plan: Return again in 4 weeks.  Diagnosis: Major depressive disorder, recurrent episode, mild with anxious distress  Neena Rhymes Dennise Raabe, LCSW 02/03/2021

## 2021-02-05 NOTE — Plan of Care (Signed)
Client is in agreement with the treatment plan. °

## 2021-03-03 DIAGNOSIS — K219 Gastro-esophageal reflux disease without esophagitis: Secondary | ICD-10-CM | POA: Insufficient documentation

## 2021-03-10 ENCOUNTER — Ambulatory Visit (INDEPENDENT_AMBULATORY_CARE_PROVIDER_SITE_OTHER): Payer: 59 | Admitting: Clinical

## 2021-03-10 ENCOUNTER — Other Ambulatory Visit: Payer: Self-pay

## 2021-03-10 DIAGNOSIS — F330 Major depressive disorder, recurrent, mild: Secondary | ICD-10-CM | POA: Diagnosis not present

## 2021-03-12 NOTE — Plan of Care (Signed)
Client was in agreement with the treatment plan. °

## 2021-03-12 NOTE — Progress Notes (Signed)
THERAPIST PROGRESS NOTE  Session Time: 45 minutes  Participation Level: Active  Behavioral Response: CasualAlertDepressed  Type of Therapy: Individual Therapy  Treatment Goals addressed: work to identify cognitive distortions and reframing them  ProgressTowards Goals: Not Progressing  Interventions: CBT and Supportive  Summary:  Debbie Yoder is a 26 y.o. female who presents for the scheduled session oriented x5, appropriately dressed, and friendly.  Client denied hallucinations and delusions. Client reported today she is feeling fairly well.  Client reported since she was last seen she has been continuing to work and go to school which has been going well.  Client reported she is now working on her thesis in school.  Client reported she has been experiencing some conflict within her friendships.  Client reported in particular situation with an older female friend that she has had for some years told her that she did not want to be friends with her following her birthday recently.  Client reported and family this particular female friend gets upset about things that other people may unknowingly do what she does to people as well.  Client reported feeling a bit conflicted about how to handle her relationship with his friend moving forward.  Client reported it ties into her history of difficulty with letting things go.  Client reported she prefers to have the answer as to why people act or do certain things that may negatively affect her so she can decide how to resolve the matter.  Client reported she has the mindset of "if you make me feel bad then I will make you feel bad".  Client discussed how in previous situations she has contemplated on thinking of "ammunition" to use against someone else to make him feel bad if they cause her emotional pain.  Client reported she is talked to her siblings about how she interprets situations and was given the suggestion of not making things personal or  taking it as a personal attack.  Client reported she does tend to overthink about certain situations.  Client reported work has been an environment that has been difficult for her over time due to negative interactions with managers and other coworkers who have talked about her and treated her negatively.     Suicidal/Homicidal: Nowithout intent/plan  Therapist Response:  Therapist began the appointment asking the client how she has been doing since last seen. Therapist used CBT to utilize active listening and positive emotional support. Therapist used CBT to engage the client to ask her to describe stressors in her social and occupational relationships. Therapist used CBT to ask the client to identify her thought process towards situations that cause her to feel negative emotions. Therapist used CBT to engage the client to attempt to challenge negative perspectives for more balanced belief. Therapist assigned the client homework to practice evaluating the pros and cons of her reactions as well as challenging negative thoughts. Client was scheduled for next appointment.   Plan: Return again in 4 weeks.  Diagnosis: Major depressive disorder, recurrent episode, mild with anxious distress  Collaboration of Care: Referral or follow-up with counselor/therapist [facility] [place]  Patient/Guardian was advised Release of Information must be obtained prior to any record release in order to collaborate their care with an outside provider. Patient/Guardian was advised if they have not already done so to contact the registration department to sign all necessary forms in order for [place] to release information regarding their care.   Consent: Patient/Guardian gives verbal consent for treatment and assignment of benefits for  services provided during this visit. Patient/Guardian expressed understanding and agreed to proceed.   [place], LCSW 03/10/2021

## 2021-05-09 ENCOUNTER — Ambulatory Visit (INDEPENDENT_AMBULATORY_CARE_PROVIDER_SITE_OTHER): Payer: 59 | Admitting: Clinical

## 2021-05-09 DIAGNOSIS — F330 Major depressive disorder, recurrent, mild: Secondary | ICD-10-CM

## 2021-05-14 NOTE — Progress Notes (Signed)
? ?  THERAPIST PROGRESS NOTE ? ?Session Time: 45 minutes ? ?Participation Level: Active ? ?Behavioral Response: CasualAlertDepressed ? ?Type of Therapy: Individual Therapy ? ?Treatment Goals addressed: client will complete 80% of homework ? ?ProgressTowards Goals: Not Progressing ? ?Interventions: CBT and Supportive ? ?Summary:  ?Debbie Yoder is a 26 y.o. female who presents for the scheduled session oriented times five, appropriately dressed, and friendly. Client deni hallucinations and delusions. ?Client reported on today she has been feeling overwhelmed and sad. Client reported she recently had a panic attacks related to stress from school. Client reported she is not having a good experience with her advisor. 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 Client reported feeling depressed, not wanting to go to class, cook or care for her dog. Client reported her boyfriend has been good support. Client reported she talked to her mom who said maybe she should drop the program. Client reported she does not like her advisor but her degree is still a career she wants to pursue. Client reported the stress caused her to have passive suicidal ideations but never plan or intent to harm herself. Client reported she has had a hard time also feeling like she can express herself when she is upset to support people such as her boyfriend and close friends at times. Client reported when she expresses anger they tell her she's overreacting but when they do it, it's fine. Client reported she feels alone sometimes. Client reported her semester ends early May. ?Evidence of progress towards goal:  client reported she has not been able to engage in her preferred outlet such as boxing 0 out of 7 days per week. ? ? ?Suicidal/Homicidal: Nowithout intent/plan ? ?Therapist Response:  ?Therapist began the appointment asking the client how she has been doing since she was last  seen. ?Therapist used CBT to engage using active listening and positive emotional support towards her thoughts and feelings. ?Therapist used CBT to have the client identify her source of depression and the impact on her daily functioning. ?Therapist used CBT to engage and help the client process her depression and how she can manager her stressors by boundaries that help her to stay organized and engaged with her responsibilities. ?Therapist used CBT to assess for SI and ensure contract for safety. ?Therapist used CBT ask the client to identify her progress with frequency of use with coping skills with continued practice in her daily activity.    ?Therapist assigned the client homework for self care. ? ? ? ?Plan: Return again in 5 weeks. ? ?Diagnosis: major depressive disorder, recurrent episode, mild with anxious distress ? ?Collaboration of Care: Patient refused AEB none requested by the client. ? ?Patient/Guardian was advised Release of Information must be obtained prior to any record release in order to collaborate their care with an outside provider. Patient/Guardian was advised if they have not already done so to contact the registration department to sign all necessary forms in order for [place] to release information regarding their care.  ? ?Consent: Patient/Guardian gives verbal consent for treatment and assignment of benefits for services provided during this visit. Patient/Guardian expressed understanding and agreed to proceed.  ? ?Birdena Jubilee Tangi Shroff, LCSW ?05/09/2021 ? ?

## 2021-05-26 ENCOUNTER — Ambulatory Visit (INDEPENDENT_AMBULATORY_CARE_PROVIDER_SITE_OTHER): Payer: Commercial Managed Care - HMO | Admitting: Clinical

## 2021-05-26 DIAGNOSIS — F330 Major depressive disorder, recurrent, mild: Secondary | ICD-10-CM

## 2021-05-26 NOTE — Progress Notes (Signed)
? ?  THERAPIST PROGRESS NOTE ? ?Session Time: 45 minutes ? ?Participation Level: Active ? ?Behavioral Response: CasualAlertEuthymic ? ?Type of Therapy: Individual Therapy ? ?Treatment Goals addressed: client will complete 80% of assigned homework  ? ?ProgressTowards Goals: Progressing ? ?Interventions: CBT and Supportive ? ?Summary:  ?Debbie Yoder is a 26 y.o. female who presents for the scheduled session oriented times five, appropriately dressed, and friendly. Client denied hallucinations and delusions. ?Client reported on today she is feeling better. Client reported today is her last day of class and she is expecting to have all good grades. Client reported she has been trying to keep minimal contact with her advisor. Client reported her advisor has made her process with grad school stressful and has conducted himself in a unprofessional manner. Client reported he has yelled at her and talked about her to others. Client reported she has been going back and forth about whether she should quit the program and apply straight for vet school or go a completely different route. Client reported she has been worried about her weight. Client reported she exercises and eats better but has not seen changes. Client reported she is prediabetic but is also going to the doctor soon to determine if she has PCOS. Client reported she also feels like others treat her negatively because of her height and overlook her opinions. Client reported she has been told she takes things personally but feels that she is justified for how she feels. Client reported she has had a hard time growing into adulthood because her mother questions and controls her every mood.  ?Evidence of progress towards goal:  client 1 negative cognitive pattern that originates from childhood which contributes to depression. ? ? ?Suicidal/Homicidal: Nowithout intent/plan ? ?Therapist Response:  ?Therapist began the appointment asking the client how she has been  doing since last seen. ?Therapist used CBT to engage using active listening and positive emotional support towards her thoughts and feelings. ?Therapist used CBT to engage ask the client to discuss her sources for continued depression. ?Therapist used CBT to ask the client to identify improvements from depressive symptoms. ?Therapist used CBT ask the client to identify her progress with frequency of use with coping skills with continued practice in her daily activity.    ?Therapist assigned the client homework to practice self care. ?Client was scheduled for next appointment. ? ? ? ?Plan: Return again in 5 weeks. ? ?Diagnosis: major depressive disorder,recurrent episode mild with anxious distress ? ?Collaboration of Care: Patient refused AEB none requested by the client. ? ?Patient/Guardian was advised Release of Information must be obtained prior to any record release in order to collaborate their care with an outside provider. Patient/Guardian was advised if they have not already done so to contact the registration department to sign all necessary forms in order for [place] to release information regarding their care.  ? ?Consent: Patient/Guardian gives verbal consent for treatment and assignment of benefits for services provided during this visit. Patient/Guardian expressed understanding and agreed to proceed.  ? ?Neena Rhymes Charitie Hinote, LCSW ?05/26/2021 ? ?

## 2021-05-27 ENCOUNTER — Encounter (HOSPITAL_COMMUNITY): Payer: Self-pay

## 2021-05-27 NOTE — Plan of Care (Signed)
?  Problem: Depression CCP Problem  1 Goal: LTG: Machel WILL SCORE LESS THAN 10 ON THE PATIENT HEALTH QUESTIONNAIRE (PHQ-9) Outcome: Progressing Goal: STG: Denae WILL PARTICIPATE IN AT LEAST 80% OF SCHEDULED INDIVIDUAL PSYCHOTHERAPY SESSIONS Outcome: Progressing Goal: STG: Takita WILL COMPLETE AT LEAST 80% OF ASSIGNED HOMEWORK Outcome: Progressing   

## 2021-06-02 ENCOUNTER — Encounter: Payer: Self-pay | Admitting: Family Medicine

## 2021-06-02 ENCOUNTER — Ambulatory Visit: Payer: Commercial Managed Care - HMO | Admitting: Family Medicine

## 2021-06-02 VITALS — BP 129/91 | HR 79 | Wt 181.0 lb

## 2021-06-02 DIAGNOSIS — N926 Irregular menstruation, unspecified: Secondary | ICD-10-CM | POA: Diagnosis not present

## 2021-06-02 DIAGNOSIS — E8881 Metabolic syndrome: Secondary | ICD-10-CM | POA: Diagnosis not present

## 2021-06-02 NOTE — Progress Notes (Signed)
? ?  GYNECOLOGY OFFICE VISIT NOTE ? ?History:  ? Debbie Yoder is a 26 y.o. female here today to discuss possible PCOS.  ? ?Patient reports she has had irregular periods since age 47. Reports she often skips a period for a month and then it returns. Usually skips 2-3 periods per year. LMP 05/05/21. Currently sexually active, using withdrawal method. Last intercourse 2 days ago. Reports her periods are overall normal in flow, though they have been a little bit heavier recently. Lasts about 4 days but sometimes has spotting before and after. Recently diagnosed with prediabetes in February at PCP (A1C 6.0). Reports mom had PCOS and thought this could be what is going on as well. Here today to talk about further work up and treatment options.  ? ?Denies abnormal vaginal discharge, pelvic pain, or dysuria.  ? ?Past Medical History:  ?Diagnosis Date  ? Migraine   ? ?History reviewed. No pertinent surgical history. ? ?The following portions of the patient's history were reviewed and updated as appropriate: allergies, current medications, past family history, past medical history, past social history, past surgical history and problem list.  ? ?Health Maintenance:  Last pap smear 11/2018 and normal. Due for repeat 11/2021.  ? ?Review of Systems:  ?Pertinent items noted in HPI and remainder of comprehensive ROS otherwise negative. ? ?Physical Exam:  ? ?BP (!) 129/91   Pulse 79   Wt 181 lb (82.1 kg)   LMP 05/05/2021 (Exact Date)   BMI 36.56 kg/m?  ? ?CONSTITUTIONAL: Well-developed, well-nourished female in no acute distress.  ?HEENT:  Normocephalic, atraumatic. EOMI, conjunctivae clear. ?CARDIOVASCULAR: Normal heart rate noted. ?RESPIRATORY: Normal work of breathing on room air.  ?SKIN: No rashes or lesions noted. No excessive hair growth noted.  ?MUSCULOSKELETAL: Normal range of motion. No LE edema noted. ?NEUROLOGIC: Alert and oriented to person, place, and time.  ?PSYCHIATRIC: Normal mood and affect. ? ?Assessment and  Plan:  ? ?1. Irregular menses ?2. Insulin resistance ?Discussed diagnostic criteria for PCOS. Patient does have irregular menses and oligomenorrhea. Denies symptoms of androgen excess. Not evident on exam today. Patient reports insulin resistance with recently starting Metformin by her PCP due to prediabetes. Discussed that this is commonly seen with PCOS, but is not part of the diagnostic criteria. Recommended lab work below for further evaluation. Discussed treatment with OCPs to help with regulating menses. Patient does not wish to start hormonal birth control at this time, but will think about this as an option. Will have patient return in 2 weeks for UPT given recent unprotected intercourse. Recommended that she abstain or have protected intercourse in the interim should she wish to start birth control method. Will follow up with patient in 6 weeks to discuss results and further workup if indicated. All questions and concerns addressed.  ?- Testosterone,Free and Total ?- HgB A1c ?- Prolactin ?- 17-Hydroxyprogesterone ? ?Routine preventative health maintenance measures emphasized. ? ?Please refer to After Visit Summary for other counseling recommendations.  ? ?Return in about 2 weeks (around 06/16/2021) for follow up nurse visit. Return in 6 weeks for follow up MD visit.   ? ?Evalina Field, MD ?OB Fellow, Faculty Practice ?[place] ? ?

## 2021-06-02 NOTE — Progress Notes (Signed)
Patient believe she may have PCOS due to irregular periods and being prediabetic  ?

## 2021-06-03 ENCOUNTER — Encounter: Payer: Self-pay | Admitting: Family Medicine

## 2021-06-09 ENCOUNTER — Ambulatory Visit (INDEPENDENT_AMBULATORY_CARE_PROVIDER_SITE_OTHER): Payer: Commercial Managed Care - HMO | Admitting: Clinical

## 2021-06-09 DIAGNOSIS — F330 Major depressive disorder, recurrent, mild: Secondary | ICD-10-CM | POA: Diagnosis not present

## 2021-06-16 ENCOUNTER — Ambulatory Visit (INDEPENDENT_AMBULATORY_CARE_PROVIDER_SITE_OTHER): Payer: Commercial Managed Care - HMO

## 2021-06-16 ENCOUNTER — Other Ambulatory Visit (INDEPENDENT_AMBULATORY_CARE_PROVIDER_SITE_OTHER): Payer: Commercial Managed Care - HMO

## 2021-06-16 VITALS — BP 121/76 | HR 92 | Wt 181.4 lb

## 2021-06-16 DIAGNOSIS — Z3202 Encounter for pregnancy test, result negative: Secondary | ICD-10-CM | POA: Diagnosis not present

## 2021-06-16 DIAGNOSIS — N926 Irregular menstruation, unspecified: Secondary | ICD-10-CM

## 2021-06-16 LAB — POCT PREGNANCY, URINE: Preg Test, Ur: NEGATIVE

## 2021-06-16 NOTE — Progress Notes (Signed)
Patient here today following up from visit on 06/02/21 for pregnancy test and blood work. Urine pregnancy test negative. Blood work obtained without issue. I reviewed patient's follow up appointment date and time with her. Patient verbalized understanding and denies any other questions.   Paulina Fusi, RN 06/16/21

## 2021-06-17 MED ORDER — NORETHINDRONE 0.35 MG PO TABS: 1.0000 | ORAL_TABLET | Freq: Every day | ORAL | 11 refills | Status: DC

## 2021-06-19 LAB — TESTOSTERONE,FREE AND TOTAL
Testosterone, Free: 1.1 pg/mL (ref 0.0–4.2)
Testosterone: 32 ng/dL (ref 13–71)

## 2021-06-19 LAB — PROLACTIN: Prolactin: 8.8 ng/mL (ref 4.8–23.3)

## 2021-06-19 LAB — HEMOGLOBIN A1C
Est. average glucose Bld gHb Est-mCnc: 128 mg/dL
Hgb A1c MFr Bld: 6.1 % — ABNORMAL HIGH (ref 4.8–5.6)

## 2021-06-19 LAB — 17-HYDROXYPROGESTERONE: 17-OH Progesterone LCMS: 135 ng/dL

## 2021-06-23 ENCOUNTER — Ambulatory Visit (INDEPENDENT_AMBULATORY_CARE_PROVIDER_SITE_OTHER): Payer: Commercial Managed Care - HMO | Admitting: Clinical

## 2021-06-23 DIAGNOSIS — F330 Major depressive disorder, recurrent, mild: Secondary | ICD-10-CM | POA: Diagnosis not present

## 2021-06-23 NOTE — Progress Notes (Unsigned)
   THERAPIST PROGRESS NOTE  Session Time: 45 minutes  Participation Level: Active  Behavioral Response: CasualAlertEuthymic  Type of Therapy: Individual Therapy  Treatment Goals addressed: client will complete 80% of homework  ProgressTowards Goals: Progressing  Interventions: CBT and Supportive  Summary:  Debbie Yoder is a 26 y.o. female who presents for the scheduled session oriented times five, appropriately dressed, and friendly. Client denied hallucinations and delusions. Client reported on today she is doing pretty well. 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 Client reported school is still giving her some stress due to the unprofessional character. Client reported he stresses her out and seems to pick apart everything that she does. Client described her advisor as a Child psychotherapist. Client reported she is hoping that with the new student joining the program she will distract the advisor from being on her so much. Client reported otherwise she should be preparing for writing her thesis. Client reported related to her social interaction she has been wondering if she is picking people apart or if there are genuine problems with other people. Client reported for example one friend doesn't seem to be listening to her and will take the same advice she gives but accepts it to other people.  Evidence of progress towards goal:  client reported she re-engaging in physical exercise 7 out of 7 days per week. Client reported she is also using positive affirmation and changing neagtive words at least 4 out of 7 days per week.   Suicidal/Homicidal: Nowithout intent/plan  Therapist Response:  Therapist began the appointment asking the client how she has been doing since last seen. Therapist used CBT to engage using active listening and positive emotional support. Therapist used CBT to engage and ask the client about  stressors provoking anxiety and/depression symptoms. Therapist used CBT to discuss challenging negative thoughts and creating balanced thoughts. Therapist used CBT ask the client to identify her progress with frequency of use with coping skills with continued practice in her daily activity.    Therapist assigned the client homework to continue practicing self care and reframing negative thoughts. Client was scheduled for next appointment.    Plan: Return again in 5 weeks.  Diagnosis: MDD, recurrent, mild with anxious distress  Collaboration of Care: Patient refused AEB none requested by the client.  Patient/Guardian was advised Release of Information must be obtained prior to any record release in order to collaborate their care with an outside provider. Patient/Guardian was advised if they have not already done so to contact the registration department to sign all necessary forms in order for [place] to release information regarding their care.   Consent: Patient/Guardian gives verbal consent for treatment and assignment of benefits for services provided during this visit. Patient/Guardian expressed understanding and agreed to proceed.   [place], LCSW 06/23/2021

## 2021-06-25 NOTE — Plan of Care (Signed)
  Problem: Depression CCP Problem  1 Goal: LTG: Namiko WILL SCORE LESS THAN 10 ON THE PATIENT HEALTH QUESTIONNAIRE (PHQ-9) Outcome: Progressing Goal: STG: Aidynn WILL PARTICIPATE IN AT LEAST 80% OF SCHEDULED INDIVIDUAL PSYCHOTHERAPY SESSIONS Outcome: Progressing Goal: STG: Nikko WILL COMPLETE AT LEAST 80% OF ASSIGNED HOMEWORK Outcome: Progressing   

## 2021-06-26 NOTE — Plan of Care (Signed)
  Problem: Depression CCP Problem  1 Goal: LTG: Debbie Yoder WILL SCORE LESS THAN 10 ON THE PATIENT HEALTH QUESTIONNAIRE (PHQ-9) Outcome: Progressing Goal: STG: Debbie Yoder WILL PARTICIPATE IN AT LEAST 80% OF SCHEDULED INDIVIDUAL PSYCHOTHERAPY SESSIONS Outcome: Progressing Goal: STG: Debbie Yoder WILL COMPLETE AT LEAST 80% OF ASSIGNED HOMEWORK Outcome: Progressing   

## 2021-06-26 NOTE — Progress Notes (Signed)
   THERAPIST PROGRESS NOTE  Session Time: 30 minutes  Participation Level: Active  Behavioral Response: CasualAlertEuthymic  Type of Therapy: Individual Therapy  Treatment Goals addressed: client will complete 80% of homework  ProgressTowards Goals: Progressing  Interventions: CBT and Supportive  Summary:  Debbie Yoder is a 26 y.o. female who presents for the scheduled session oriented times five, appropriately dressed, and friendly. Client denied hallucinations and delusions. Client reported on today she is doing well with school semester being over. Client reported she is looking forward to getting back to activities she did not have time to do during the school year. Client reported she is wanting to get back into the gym and improve her eating. Client reported some frustration with maintaining her weight due to suspected PCOS symptoms. Client reported overall she is happy with herself and her lifestyles she makes to keep herself in good health. Client reported she went home for Mothers Day to see her mom and that went well. 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 Client reported after all the stress she endured in school she is pleased with the result of her grades. Client reported she got a 4.0 for the semester. Evidence of progress towards goal:  client reported engaging in planned behavioral activation activities at least 4 out of 7 days per week.   Suicidal/Homicidal: Nowithout intent/plan  Therapist Response:  Therapist began the appointment asking the client how she has been doing since last seen. Therapist used CBT to engage using active listening and positive emotional support. Therapist used CBT to engage and ask the client about her stress level improvement since finishing school. Therapist used CBT to discuss stress management ans reframing negative to positive by identifying her  accomplishments. Therapist used CBT ask the client to identify her progress with frequency of use with coping skills with continued practice in her daily activity.    Therapist assigned the client homework to complete self care. Client was scheduled for next appointment.    Plan: Return again in 5 weeks.  Diagnosis: major depressive disorder, recurrent episode mild with anxious distress  Collaboration of Care: Patient refused AEB none  Patient/Guardian was advised Release of Information must be obtained prior to any record release in order to collaborate their care with an outside provider. Patient/Guardian was advised if they have not already done so to contact the registration department to sign all necessary forms in order for [place] to release information regarding their care.   Consent: Patient/Guardian gives verbal consent for treatment and assignment of benefits for services provided during this visit. Patient/Guardian expressed understanding and agreed to proceed.   [name], LCSW 06/26/2021

## 2021-07-14 ENCOUNTER — Ambulatory Visit (INDEPENDENT_AMBULATORY_CARE_PROVIDER_SITE_OTHER): Payer: Commercial Managed Care - HMO | Admitting: Obstetrics & Gynecology

## 2021-07-14 ENCOUNTER — Encounter: Payer: Self-pay | Admitting: Obstetrics & Gynecology

## 2021-07-14 ENCOUNTER — Other Ambulatory Visit: Payer: Self-pay

## 2021-07-14 VITALS — BP 139/89 | HR 91 | Wt 186.3 lb

## 2021-07-14 DIAGNOSIS — N926 Irregular menstruation, unspecified: Secondary | ICD-10-CM | POA: Diagnosis not present

## 2021-07-14 DIAGNOSIS — F330 Major depressive disorder, recurrent, mild: Secondary | ICD-10-CM | POA: Diagnosis not present

## 2021-07-14 MED ORDER — SLYND 4 MG PO TABS: 1.0000 | ORAL_TABLET | Freq: Every day | ORAL | 3 refills | Status: DC

## 2021-07-14 NOTE — Progress Notes (Signed)
U/S scheduled for July 31st @ 1345.  Pt notified.

## 2021-08-01 ENCOUNTER — Ambulatory Visit (INDEPENDENT_AMBULATORY_CARE_PROVIDER_SITE_OTHER): Payer: Commercial Managed Care - HMO | Admitting: Clinical

## 2021-08-01 DIAGNOSIS — F330 Major depressive disorder, recurrent, mild: Secondary | ICD-10-CM | POA: Diagnosis not present

## 2021-08-09 NOTE — Progress Notes (Signed)
   THERAPIST PROGRESS NOTE  Session Time: 45 minutes  Participation Level: Active  Behavioral Response: CasualAlertIrritable  Type of Therapy: Individual Therapy  Treatment Goals addressed: client will complete 80% of assigned homework  ProgressTowards Goals: Progressing  Interventions: CBT and Supportive  Summary:  Debbie Yoder is a 26 y.o. female who presents for the scheduled session oriented times five, appropriately dressed, and friendly. Client denied hallucinations and delusions. Client reported on today she is doing fairly well. 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 Client reported she made the most of the trip. 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 Client reported the staff are all males and seem to always find an issue to pick with her. 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 Client reported sometimes she questions herself if she is the problem or if others are not giving her the slack they expect her to give them.  Evidence of progress towards goal:  client reported 1 positive improvement of utilizing positive affirmations and correcting herself when she speaks negatively about herself.   Suicidal/Homicidal: Nowithout intent/plan  Therapist Response:  Therapist began the appointment asking the client how she has been doing since last seen. Therapist used CBT to engage with active listening and positive emotional support. Therapist used CBT to engage and ask the client about recent challenges in her education and occupational settings. Therapist used CBT to engage discuss with the client about boundaries and self esteem development. Therapist used CBT to ask the client to identify positive acknowledgements of herself. Therapist used CBT  ask the client to identify her progress with frequency of use with coping skills with continued practice in her daily activity.    Therapist assigned the client homework to continue positive affirmations and boundary setting. Client was scheduled for next appointment.    Plan: Return again in 5 weeks.  Diagnosis: major depressive disorder, recurrent episode, mild with anxious distress  Collaboration of Care: Patient refused AEB none  Patient/Guardian was advised Release of Information must be obtained prior to any record release in order to collaborate their care with an outside provider. Patient/Guardian was advised if they have not already done so to contact the registration department to sign all necessary forms in order for [place] to release information regarding their care.   Consent: Patient/Guardian gives verbal consent for treatment and assignment of benefits for services provided during this visit. Patient/Guardian expressed understanding and agreed to proceed.   Debbie Rhymes Mckennah Kretchmer, LCSW 08/01/2021

## 2021-08-09 NOTE — Plan of Care (Signed)
  Problem: Depression CCP Problem  1 Goal: LTG: Coline WILL SCORE LESS THAN 10 ON THE PATIENT HEALTH QUESTIONNAIRE (PHQ-9) Outcome: Progressing Goal: STG: Lian WILL PARTICIPATE IN AT LEAST 80% OF SCHEDULED INDIVIDUAL PSYCHOTHERAPY SESSIONS Outcome: Progressing Goal: STG: Alyzah WILL COMPLETE AT LEAST 80% OF ASSIGNED HOMEWORK Outcome: Progressing   

## 2021-08-22 ENCOUNTER — Ambulatory Visit: Admission: RE | Admit: 2021-08-22 | Payer: Commercial Managed Care - HMO | Source: Ambulatory Visit

## 2021-08-28 IMAGING — CR DG CHEST 1V PORT
1 series · 1 of 1 positions shown · non-contrast
Comparison: None.

CLINICAL DATA: Shortness of breath

EXAM:
PORTABLE CHEST 1 VIEW

[AP]
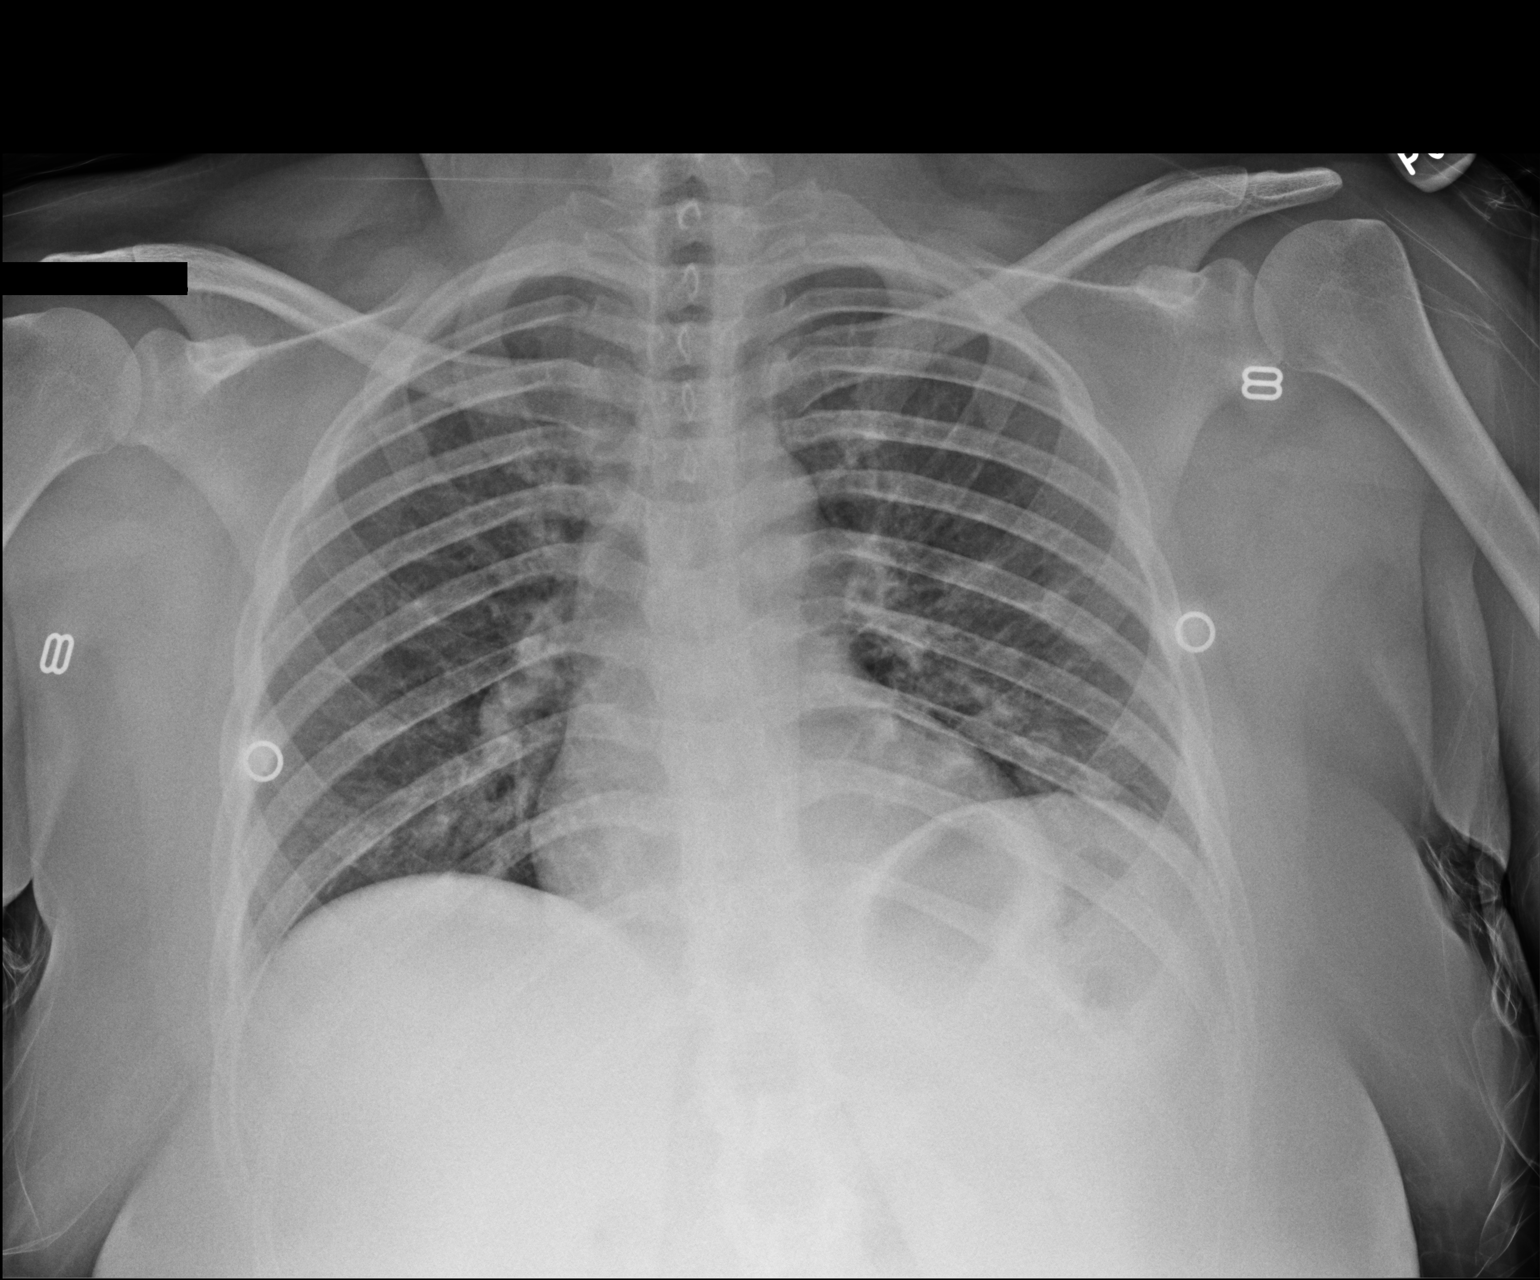

[1 of 1 positions shown; findings below may reference images not displayed]

FINDINGS: The heart size and mediastinal contours are within normal limits.
Both lungs are clear. The visualized skeletal structures are
unremarkable.
IMPRESSION: No active disease.

## 2021-09-05 ENCOUNTER — Ambulatory Visit (INDEPENDENT_AMBULATORY_CARE_PROVIDER_SITE_OTHER): Payer: Commercial Managed Care - HMO | Admitting: Clinical

## 2021-09-05 DIAGNOSIS — F330 Major depressive disorder, recurrent, mild: Secondary | ICD-10-CM

## 2021-09-05 NOTE — Progress Notes (Unsigned)
   THERAPIST PROGRESS NOTE  Session Time: 45 minutes  Participation Level: Active  Behavioral Response: CasualAlertEuthymic  Type of Therapy: Individual Therapy  Treatment Goals addressed: client will complete 80% of homework  ProgressTowards Goals: Progressing  Interventions: CBT and Supportive  Summary:  Debbie Yoder is a 26 y.o. female who presents for the scheduled appointment, oriented times five, and friendly. Client denied hallucinations and delusions. Client reported on today she is doing pretty well. Client reported she is working on her thesis for school. 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 Client reported otherwise she has been having continuous conflict with her mother. Client reported her mother constantly has things that she is mad about with her that should not be significant. 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  Client reported she also has negative emotions about socializing and making friends. Client reported that people that she does like have moved away for other opportunities. Client reported receiving negativity from others without reason from her. Client reported she feels like she should be having fun but is not. Evidence of progress towards goal:  client reported she tries to cope with negative feelings by reframing her thoughts at least 4x per week. Client reported she has also picked up on 1 new hobby which is joining a dance class.   Suicidal/Homicidal: Nowithout intent/plan  Therapist Response:  Therapist began the appointment asking the client how she has been doing since last seen. Therapist used CBT to engage using active listening and positive emotional support. Therapist used CBT to ask the client  open ended questions about stressors in educational, social and interpersonal relationships that are causing stress. Therapist used CBT to normalize the clients emotions and reinforce accepting what can't be controlled when discuss with other people. Therapist used CBT ask the client to identify her progress with frequency of use with coping skills with continued practice in her daily activity.    Therapist assigned the client homework to practice self care.    Plan: Return again in 3 weeks.  Diagnosis: MDD, recurrent episode ,mild with anxious distress  Collaboration of Care: Patient refused AEB none.  Patient/Guardian was advised Release of Information must be obtained prior to any record release in order to collaborate their care with an outside provider. Patient/Guardian was advised if they have not already done so to contact the registration department to sign all necessary forms in order for [place] to release information regarding their care.   Consent: Patient/Guardian gives verbal consent for treatment and assignment of benefits for services provided during this visit. Patient/Guardian expressed understanding and agreed to proceed.   Neena Rhymes Brogen Duell, LCSW 09/05/2021

## 2021-09-06 NOTE — Plan of Care (Signed)
  Problem: Depression CCP Problem  1 Goal: LTG: Debbie Yoder WILL SCORE LESS THAN 10 ON THE PATIENT HEALTH QUESTIONNAIRE (PHQ-9) Outcome: Progressing Goal: STG: Debbie Yoder WILL PARTICIPATE IN AT LEAST 80% OF SCHEDULED INDIVIDUAL PSYCHOTHERAPY SESSIONS Outcome: Progressing Goal: STG: Debbie Yoder WILL COMPLETE AT LEAST 80% OF ASSIGNED HOMEWORK Outcome: Progressing   

## 2021-10-04 ENCOUNTER — Ambulatory Visit (INDEPENDENT_AMBULATORY_CARE_PROVIDER_SITE_OTHER): Payer: Commercial Managed Care - HMO | Admitting: Clinical

## 2021-10-04 DIAGNOSIS — F330 Major depressive disorder, recurrent, mild: Secondary | ICD-10-CM

## 2021-10-09 NOTE — Progress Notes (Unsigned)
   THERAPIST PROGRESS NOTE  Session Time: 45 minutes  Participation Level: Active  Behavioral Response: CasualAlertEuthymic  Type of Therapy: Individual Therapy  Treatment Goals addressed: Client will participate in at least 80% of scheduled individual therapy sessions  ProgressTowards Goals: Progressing  Interventions: CBT  Summary:  Almyra Birman is a 26 y.o. female who presents for the scheduled appointment oriented times five, appropriately dressed, and friendly. Client denied hallucinations and delusions. Client reported she is doing pretty well. Client reported she has been thinking about her interactions with her boyfriend and/or friends when she needs to talk about important things. Client reported she has noticed that she will take control by listing her problems and saying what he wants to do about it so they don't have a chance to give feedback. Client reported she also has to learn how to argue in a more productive way with her boyfriend. Client reported she tends to shut down and her boyfriend does not like that. Client reported she recently went home to see her mom and siblings and has noticed that to be around her mother it takes a lot of energy out of her.  Client reported her mother tend to hold grudges for a long period of time and does not feel that she is treated like an adult by her.  Client reported otherwise school is coming along fairly well although she has her ongoing challenges with communication with her advisor she is doing her best. Evidence of progress towards goal: Client reported 1 pattern of negative behavior regarding communication that she would like to improve on.   Suicidal/Homicidal: Nowithout intent/plan  Therapist Response:  Therapist began the appointment asking the client how she has been doing since last seen. Therapist used CBT to engage using active listening and positive emotional support. Therapist used CBT to engage the client and give her  time to discuss her thoughts and feelings regarding friendships, her relationship, and friendly.   Therapist used CBT to have the client identify her emotions and behaviors that contribute to her stressors that she would like to work on. Therapist used CBT to reinforce discussion about positive communication and problem-solving skills. Therapist used CBT ask the client to identify her progress with frequency of use with coping skills with continued practice in her daily activity.    Therapist assigned client homework to practice the communication skills, and self-care.   Plan: Return again in 5 weeks.  Diagnosis: Major depressive disorder, recurrent episode, mild with anxious distress  Collaboration of Care: Patient refused AEB none requested by the client at this time.  Patient/Guardian was advised Release of Information must be obtained prior to any record release in order to collaborate their care with an outside provider. Patient/Guardian was advised if they have not already done so to contact the registration department to sign all necessary forms in order for [place] to release information regarding their care.   Consent: Patient/Guardian gives verbal consent for treatment and assignment of benefits for services provided during this visit. Patient/Guardian expressed understanding and agreed to proceed.   [place], LCSW 10/04/2021

## 2021-10-11 NOTE — Plan of Care (Signed)
  Problem: Depression CCP Problem  1 Goal: LTG: Debbie Yoder WILL SCORE LESS THAN 10 ON THE PATIENT HEALTH QUESTIONNAIRE (PHQ-9) Outcome: Progressing Goal: STG: Debbie Yoder WILL PARTICIPATE IN AT LEAST 80% OF SCHEDULED INDIVIDUAL PSYCHOTHERAPY SESSIONS Outcome: Progressing Goal: STG: Debbie Yoder WILL COMPLETE AT LEAST 80% OF ASSIGNED HOMEWORK Outcome: Progressing

## 2021-10-24 ENCOUNTER — Ambulatory Visit (INDEPENDENT_AMBULATORY_CARE_PROVIDER_SITE_OTHER): Payer: Commercial Managed Care - HMO | Admitting: Clinical

## 2021-10-24 DIAGNOSIS — F330 Major depressive disorder, recurrent, mild: Secondary | ICD-10-CM | POA: Diagnosis not present

## 2021-10-25 NOTE — Progress Notes (Signed)
   THERAPIST PROGRESS NOTE  Session Time: 45 minutes  Participation Level: Active  Behavioral Response: CasualAlertDepressed  Type of Therapy: Individual Therapy  Treatment Goals addressed: Client will complete at least 80% of assigned homework  ProgressTowards Goals: Progressing  Interventions: CBT and Supportive  Summary:  Debbie Yoder is a 26 y.o. female who presents for the scheduled appointment oriented x5, appropriately dressed, and friendly.  Client denies hallucinations and delusions. Client reported on today she has been doing fairly well.  Client reported she has been living with her father and trying to keep things on a set timeline.  Client reported he tried to impose that she do another project that may end up pushing back to graduation date.  Client reported she pushed back on doing that.  Client reported she has thought about her future career.  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  Client reported a lot of time she has been validated how she feels without support for the ideal support from others.  Client reported thinking back to the relationship with her mother.  Client reported her mother acts like she does not like her siblings at times and has even called her out of her name when she is upset.  Client reported the same issue with her friends at times.  Client reported feeling sad because she will probably have to give her cat away.  Client reported she is continuing to look at the animal hospital. Evidence of progress towards goal: Client reported using 1 skill validating her emotions at least 5 out of 7 days a week.   Suicidal/Homicidal: Nowithout intent/plan  Therapist Response:  Therapist began the appointment asking the client how she has been doing since last seen. Therapist used CBT to engage using active listening and positive emotional support. Therapist used CBT  to ask client open-ended questions and allow her time to discuss her thoughts and feelings on her stressors with family, school and social life. Therapist used CBT to validate the clients thoughts and discussing steps on how to cope with multiple stressful situations. Therapist used CBT ask the client to identify her progress with frequency of use with coping skills with continued practice in her daily activity.    Therapist assigned client homework to practice self-care.   Plan: Return again in 4 weeks.  Diagnosis: Major depressive disorder, recurrent episode, mild with anxious distress  Collaboration of Care: Patient refused AEB no other needs requested by the client.  Patient/Guardian was advised Release of Information must be obtained prior to any record release in order to collaborate their care with an outside provider. Patient/Guardian was advised if they have not already done so to contact the registration department to sign all necessary forms in order for [place] to release information regarding their care.   Consent: Patient/Guardian gives verbal consent for treatment and assignment of benefits for services provided during this visit. Patient/Guardian expressed understanding and agreed to proceed.   [place], LCSW 10/25/2021

## 2021-10-25 NOTE — Plan of Care (Signed)
  Problem: Depression CCP Problem  1 Goal: LTG: Takima WILL SCORE LESS THAN 10 ON THE PATIENT HEALTH QUESTIONNAIRE (PHQ-9) Outcome: Progressing Goal: STG: Janera WILL PARTICIPATE IN AT LEAST 80% OF SCHEDULED INDIVIDUAL PSYCHOTHERAPY SESSIONS Outcome: Progressing Goal: STG: Lan WILL COMPLETE AT LEAST 80% OF ASSIGNED HOMEWORK Outcome: Progressing   

## 2021-11-10 ENCOUNTER — Ambulatory Visit (HOSPITAL_COMMUNITY): Payer: Commercial Managed Care - HMO | Admitting: Clinical

## 2021-11-29 ENCOUNTER — Ambulatory Visit (INDEPENDENT_AMBULATORY_CARE_PROVIDER_SITE_OTHER): Payer: Commercial Managed Care - HMO | Admitting: Clinical

## 2021-11-29 DIAGNOSIS — F330 Major depressive disorder, recurrent, mild: Secondary | ICD-10-CM

## 2021-11-29 NOTE — Plan of Care (Signed)
  Problem: Depression CCP Problem  1 Goal: LTG: Debbie Yoder WILL SCORE LESS THAN 10 ON THE PATIENT HEALTH QUESTIONNAIRE (PHQ-9) Outcome: Progressing Goal: STG: Debbie Yoder WILL PARTICIPATE IN AT LEAST 80% OF SCHEDULED INDIVIDUAL PSYCHOTHERAPY SESSIONS Outcome: Progressing Goal: STG: Debbie Yoder WILL COMPLETE AT LEAST 80% OF ASSIGNED HOMEWORK Outcome: Progressing   

## 2021-11-29 NOTE — Progress Notes (Signed)
THERAPIST PROGRESS NOTE  Session Time: 45 minutes  Participation Level: Active  Behavioral Response: CasualAlertIrritable  Type of Therapy: Individual Therapy  Treatment Goals addressed: Client will score less than a 10 on the PHQ-9 questionnaire   ProgressTowards Goals: Progressing  Interventions: CBT and Supportive  Summary:  Debbie Yoder is a 26 y.o. female who presents for the scheduled appointment oriented times five, appropriately dressed, and friendly. Client denied hallucinations and delusions. Client reported on today she is doing fairly well but experiencing stress. Client reported her advisor in her program has been going back and forth about his expectations for her to graduate. Client reported he gets irritable when she asks her questions on things that she is unclear about. Client reported thinking about dropping out the program if he keeps her from graduating on time. 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 Client reported she has also been having some issues with her best friend. Client reported her best friend tries to get her to hang with her other friends from work who she does not like. Client reported her friend makes questionable decisions and does not seem to treat her well and picking her other friends over her. Client reported she is looking forward to moving somewhere new next year with her boyfriend. Client reported "I feel like people would be better off without me in their life". Client reported she seems to make other people upset or something about her. Client reported she wants to raise a family one day and be off on her own. Client reported she has noticed having more mood swings lately to when she has sporadic moments of irritability. Client reported yelling and punching the wall. Client reported she knows it comes from friends and school stress. Client reported 2 or 3 days out  of the week. Evidence of progress towards goal:  client reported 1 cognitive pattern that contributes to depression.    11/29/2021    8:47 AM 07/14/2021    2:42 PM 08/16/2020    9:24 AM  GAD 7 : Generalized Anxiety Score  Nervous, Anxious, on Edge 1 3 2   Control/stop worrying 1 2 1   Worry too much - different things 1 1 1   Trouble relaxing 1 2 2   Restless 0 0 1  Easily annoyed or irritable 2 1 3   Afraid - awful might happen 0 3 3  Total GAD 7 Score 6 12 13   Anxiety Difficulty Very difficult  Very difficult     Flowsheet Row Counselor from 11/29/2021 in [facility]  PHQ-9 Total Score 7         Suicidal/Homicidal: Nowithout intent/plan  Therapist Response:  Therapist began the appointment asking the client how she has been doing since last seen. Therapist used CBT to engage using active listening and positive emotional support. Therapist used CBT to give the client time to discuss her stress related to friends and school. Therapist used CBT to normalize the clients emotions and discuss implementing emotional boundaries and how to identify cognitive distortions and challenging them. Therapist used CBT ask the client to identify her progress with frequency of use with coping skills with continued practice in her daily activity.    Therapist assigned the client homework to practice self care and boundaries.   Plan: Return again in 3 weeks.  Diagnosis: major depressive disorder, recurrent episode, mild with anxious distress  Collaboration of Care: Patient refused AEB none  requested by the client.  Patient/Guardian was advised Release of Information must be obtained prior to any record release in order to collaborate their care with an outside provider. Patient/Guardian was advised if they have not already done so to contact the registration department to sign all necessary forms in order for [place] to release information regarding their care.   Consent:  Patient/Guardian gives verbal consent for treatment and assignment of benefits for services provided during this visit. Patient/Guardian expressed understanding and agreed to proceed.   [place], LCSW 11/29/2021

## 2021-12-01 ENCOUNTER — Ambulatory Visit (INDEPENDENT_AMBULATORY_CARE_PROVIDER_SITE_OTHER): Payer: Commercial Managed Care - HMO

## 2021-12-01 DIAGNOSIS — Z3201 Encounter for pregnancy test, result positive: Secondary | ICD-10-CM

## 2021-12-01 LAB — POCT PREGNANCY, URINE: Preg Test, Ur: POSITIVE — AB

## 2021-12-01 NOTE — Progress Notes (Signed)
Pt dropped off urine today for UPT. UPT is positive. Pt called and given results. Pt states taking 3 at home UPT that were positive. Pt states this is her first pregnancy. Pt states isn't sure where she wants to establish care for prenatal visits. Pt advised to call back to front office if she decides to come here for care. Pt verbalized understanding.  Pt denies any vaginal bleeding, abd pain or cramps at this time. Pt advised to start taking PNV and to call OB provider for next OB appt within the next 5 weeks. Pt verbalized understanding and agreeable to plan of care.   LMP: 10/14/21 EDC: 07/21/2022 [redacted]w[redacted]d   Judeth Cornfield, RN

## 2021-12-13 ENCOUNTER — Ambulatory Visit: Payer: Self-pay

## 2021-12-13 DIAGNOSIS — O039 Complete or unspecified spontaneous abortion without complication: Secondary | ICD-10-CM

## 2021-12-13 DIAGNOSIS — IMO0001 Reserved for inherently not codable concepts without codable children: Secondary | ICD-10-CM

## 2021-12-13 HISTORY — DX: Reserved for inherently not codable concepts without codable children: IMO0001

## 2021-12-13 HISTORY — DX: Complete or unspecified spontaneous abortion without complication: O03.9

## 2021-12-13 NOTE — Telephone Encounter (Signed)
  Chief Complaint: vaginal pain Symptoms: vaginal pain 10/10, crying  Frequency: today Pertinent Negatives: NA Disposition: [x] ED /[] Urgent Care (no appt availability in office) / [] Appointment(In office/virtual)/ []  [facility]/ [] Home Care/ [] Refused Recommended Disposition /[] [facility]/ []  Follow-up with PCP Additional Notes: pt states she took abortion pill (mifepristone and misoprostol) and is in severe pain. Advised with Cytotec cramping was common d/t it dilating the cervix but pt sounded in severe pain so advised going to [facility] and being assessed. Pt verbalized understanding.   Reason for Disposition  Patient sounds very sick or weak to the triager  Answer Assessment - Initial Assessment Questions 1. LOCATION: "Where does it hurt?"      Vaginal pain      3. ONSET: "When did the pain begin?" (e.g., minutes, hours or days ago)      today 4. ONSET: "Gradual or sudden onset?"     sudden 5. PATTERN "Does the pain come and go, or has it been constant since it started?"      constant 6. SEVERITY: "How bad is the pain?" "What does it keep you from doing?"  (e.g., Scale 1-10; mild, moderate, or severe)   - MILD (1-3): Doesn't interfere with normal activities, abdomen soft and not tender to touch.    - MODERATE (4-7): Interferes with normal activities or awakens from sleep, abdomen tender to touch.    - SEVERE (8-10): Excruciating pain, doubled over, unable to do any normal activities.     10+ crying  8. CAUSE: "What do you think is causing the stomach pain?"     Took abortion pill [redacted] weeks pregnant  Protocols used: Pregnancy - Abdominal Pain Less Than [redacted] Weeks EGA-A-AH

## 2021-12-19 ENCOUNTER — Ambulatory Visit (INDEPENDENT_AMBULATORY_CARE_PROVIDER_SITE_OTHER): Payer: Commercial Managed Care - HMO | Admitting: Clinical

## 2021-12-19 DIAGNOSIS — F330 Major depressive disorder, recurrent, mild: Secondary | ICD-10-CM | POA: Diagnosis not present

## 2021-12-19 NOTE — Progress Notes (Signed)
   THERAPIST PROGRESS NOTE  Session Time: 45 minutes  Participation Level: Active  Behavioral Response: CasualAlertDepressed  Type of Therapy: Individual Therapy  Treatment Goals addressed: client will participate in 80% of scheduled individual psychotherapy sessions  ProgressTowards Goals: Not Progressing  Interventions: CBT and Supportive  Summary:  Debbie Yoder is a 26 y.o. female who presents for the scheduled appointment oriented times five, appropriately dressed, and friendly. Client denied hallucinations and delusions. Client reported on today having a mix of emotions. Client reported regarding school the pigs for her project did not get pregnant so she will not be able to proceed with an extra assignment her advisor gave her. Client reported she is happy about that. Client reported through her graduate studies she has been very stressed dealing with her advisor. Client reported he continuously finds new work for her to do which isn't necessarily required but he makes her do it while other students do nothing. Client reported she has started recording her conversations with him to prove he yells and talks to her in a negative way. Client reported she shared this information with another faculty in the department who seems to be supportive of her. Client reported since her last therapy session she found out she was pregnant and two days before thanksgiving she had an abortion. Client reported having thoughts and feelings of depression and guilt. Client reported she figured with the stress and financial stress existing she would not be able to support a baby. Client reported crying spells and not praying because she feels that God would be disappointed. Client reported only her sister and boyfriend know about her pregnancy. Client reported otherwise thinking about the quality of her friendships and wanting to grow from her experience with work and people at school. Evidence of progress  towards goal:  Client reported 1 goal of going back to doing activity she enjoys which is going to the gym at least 2x per week.   Suicidal/Homicidal: Nowithout intent/plan  Therapist Response:  Therapist began the appointment asking the client how she has been doing since last seen. Therapist used CBT to engage using active listening and positive emotional support. Therapist used CBT to actively listen as the client discussed stressors pertaining to interpersonal relationships, school, work, and her health. Therapist used CBT to ask the client open-ended questions about her thoughts and feelings pertaining to having an abortion. Therapist used CBT to empathize with the clients thoughts and emotions within reason and reinforce positive use of coping skills. Therapist used CBT ask the client to identify her progress with frequency of use with coping skills with continued practice in her daily activity.    Therapist assigned the client homework to practice positive coping skills.    Plan: Return again in 3 weeks.  Diagnosis: major depressive disorder, recurrent episode, mild with anxious distress  Collaboration of Care: Patient refused AEB none requested by the client at this time.  Patient/Guardian was advised Release of Information must be obtained prior to any record release in order to collaborate their care with an outside provider. Patient/Guardian was advised if they have not already done so to contact the registration department to sign all necessary forms in order for [place] to release information regarding their care.   Consent: Patient/Guardian gives verbal consent for treatment and assignment of benefits for services provided during this visit. Patient/Guardian expressed understanding and agreed to proceed.   Neena Rhymes Lannette Avellino, LCSW 12/19/2021

## 2021-12-19 NOTE — Plan of Care (Signed)
  Problem: Depression CCP Problem  1 Goal: STG: Diem WILL PARTICIPATE IN AT LEAST 80% OF SCHEDULED INDIVIDUAL PSYCHOTHERAPY SESSIONS Outcome: Progressing Goal: STG: Betzabeth WILL COMPLETE AT LEAST 80% OF ASSIGNED HOMEWORK Outcome: Progressing   Problem: Depression CCP Problem  1 Goal: LTG: Taiylor WILL SCORE LESS THAN 10 ON THE PATIENT HEALTH QUESTIONNAIRE (PHQ-9) Outcome: Not Progressing

## 2021-12-29 ENCOUNTER — Encounter: Payer: Self-pay | Admitting: Certified Nurse Midwife

## 2021-12-29 ENCOUNTER — Ambulatory Visit (INDEPENDENT_AMBULATORY_CARE_PROVIDER_SITE_OTHER): Payer: Commercial Managed Care - HMO | Admitting: Certified Nurse Midwife

## 2021-12-29 DIAGNOSIS — N926 Irregular menstruation, unspecified: Secondary | ICD-10-CM

## 2021-12-29 DIAGNOSIS — Z3009 Encounter for other general counseling and advice on contraception: Secondary | ICD-10-CM | POA: Diagnosis not present

## 2021-12-29 DIAGNOSIS — R030 Elevated blood-pressure reading, without diagnosis of hypertension: Secondary | ICD-10-CM | POA: Diagnosis not present

## 2021-12-29 NOTE — Progress Notes (Signed)
History:  Ms. Debbie Yoder is a 26 y.o. [id number] who presents to clinic today for Follow up post termination. Patient recently had a termination of pregnancy on 12/13/21 in [place] using misoprostol. She states it was traumatic for her with bleeding and abdominal pain to follow. She believed she was making the best decision for her, but states she "regrets" the decision in its entirety.  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 She reports that she has a good support system in place and states that her long-term boyfriend of 4 years fully supported her in her decision. Patient very tearful during visit. She noted that she has a therapist in place for on-going depression. Denies SI/HI. She has resumed sexual activity and is currently using condoms for contraception. She endorses still having some light brown spotting without pain and was seeing Dr. Marquis Lunch. Anyanwu for irregular menses but missed her [place] appointment. She states she is planning to have children in the next 1-2 years.    The following portions of the patient's history were reviewed and updated as appropriate: allergies, current medications, family history, past medical history, social history, past surgical history and problem list.  Review of Systems:  Review of Systems  Unable to perform ROS: Other (Patient endorses vaginal spotting s/p TAB that has not resolved. Denies pain and discomfort)      Objective:  Physical Exam BP 129/88   Pulse 81   Ht 4\' 11"  (1.499 m)   Wt 187 lb 3.2 oz (84.9 kg)   LMP 11/19/2021 (Exact Date) Comment: 4 days; light-moderate bleeding  Breastfeeding Unknown   BMI 37.81 kg/m  Physical Exam Constitutional:      General: She is not in acute distress.    Appearance: Normal appearance.  HENT:     Head: Normocephalic.  Cardiovascular:     Rate and Rhythm: Normal rate.  Pulmonary:     Effort: Pulmonary effort is normal. No respiratory distress.  Genitourinary:    Vagina: No vaginal  discharge.  Musculoskeletal:        General: Normal range of motion.     Cervical back: Normal range of motion.  Skin:    General: Skin is warm and dry.  Neurological:     Mental Status: She is alert and oriented to person, place, and time.  Psychiatric:        Mood and Affect: Mood normal.        Behavior: Behavior normal.      Labs and Imaging No results found for this or any previous visit (from the past 24 hour(s)).  No results found.   Assessment & Plan:  1. Follow-up visit after therapeutic abortion - Beta hCG quant (ref lab) as baseline.  - Repeat Quant and or UPT in 2 weeks. Total 4 weeks from TAB initiation.   - Heavy bleeding ceased 2 weeks ago, normal period like bleeding stopped last week. Discussed that brown spotting is old blood and likely the end of bleeding.  - Warning/ Worsening signs reviewed and patient verbalized understanding.   2. Birth control counseling - At this time, patient is comfortable with Condom use, but does not endorse using them in every encounter.  - Stressed the importance of consistent condom usage to prevent pregnancy.  - Patient states she spoke with Dr. 11/21/2021 about use of Slynd for PCOS and regulation of menses. States she does not want to use a pill d/t non-compliance.  - List of Birth Control  options provided for patient.   3. Elevated BP without diagnosis of hypertension - Patient had 2 elevated BPs on arrival.  - After very tearful discussion patient BPs normalized.  - Encouraged Follow up with PCP.   4. Irregular menses - Repeat US order placed.  - US Pelvis Complete; Future - Follow up in Jan/Feb of 2024 for PAP and STD testing per patient request.    Return in about 2 weeks (around 01/12/2022) for Nurse VIsit for UPT with Quant .  Derika Eckles Danella Deis) Suzie Portela, MSN, CNM  [facility]  12/29/21 1:36 PM

## 2021-12-30 LAB — BETA HCG QUANT (REF LAB): hCG Quant: 136 m[IU]/mL

## 2022-01-02 ENCOUNTER — Ambulatory Visit (HOSPITAL_COMMUNITY): Payer: Commercial Managed Care - HMO | Admitting: Clinical

## 2022-01-03 ENCOUNTER — Ambulatory Visit (INDEPENDENT_AMBULATORY_CARE_PROVIDER_SITE_OTHER): Payer: Commercial Managed Care - HMO | Admitting: Clinical

## 2022-01-03 DIAGNOSIS — F330 Major depressive disorder, recurrent, mild: Secondary | ICD-10-CM

## 2022-01-03 NOTE — Progress Notes (Signed)
   THERAPIST PROGRESS NOTE  Session Time: 45 minutes  Participation Level: Active  Behavioral Response: CasualAlertEuthymic  Type of Therapy: Individual Therapy  Treatment Goals addressed: client will complete 80% of assigned homework  ProgressTowards Goals: Progressing  Interventions: CBT and Supportive  Summary:  Debbie Yoder is a 26 y.o. female who presents for the scheduled appointment oriented times five, appropriately dressed, and friendly. Client denied hallucinations and delusions. Client reported on today she is feeling okay but still has some sadness. Client reported she made a follow up appointment with the OBGYN. Client reported the midwife made a comment that she "should've thrown the pill up" if she felt bad about having an abortion done. 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 Client reported she has been thinking about her future and wanting to marry her boyfriend. Client reported she has pressed the topic with his but is not sure why he hasn't followed through with it yet. Client reported today is her last week for exams and she will be on break. Client reported she is looking to get back into going to the gym. Client reported she recently did a christmas dinner with other girls from her sorority and that went good.  Evidence of progress towards goal:  client reported using positive behavioral activation at least 3 out of 7 days per week.    Suicidal/Homicidal: Nowithout intent/plan  Therapist Response:  Therapist began the appointment asking the client how she has been doing.  Therapist used CBT to engage using active listening and positive emotional support. Therapist used CBT to give her time to discuss her thoughts and feelings related to school, work and interpersonal relationships. Therapist used CBT to engage and empathize and normalize the clients emotional response to stressors. Therapist  used CBT to discuss stress management techniques. Therapist used CBT ask the client to identify her progress with frequency of use with coping skills with continued practice in her daily activity.    Therapist assigned the client homework to practice self care.   Plan: Return again in 3 weeks.  Diagnosis: major depressive disorder, mild, with anxious distress  Collaboration of Care: Patient refused AEB none requested by the client.  Patient/Guardian was advised Release of Information must be obtained prior to any record release in order to collaborate their care with an outside provider. Patient/Guardian was advised if they have not already done so to contact the registration department to sign all necessary forms in order for [place] to release information regarding their care.   Consent: Patient/Guardian gives verbal consent for treatment and assignment of benefits for services provided during this visit. Patient/Guardian expressed understanding and agreed to proceed.   Neena Rhymes Alan Drummer, LCSW 01/03/2022

## 2022-01-12 ENCOUNTER — Ambulatory Visit
Admission: RE | Admit: 2022-01-12 | Discharge: 2022-01-12 | Disposition: A | Discharge status: Home / Self Care | Payer: Commercial Managed Care - HMO | Source: Ambulatory Visit | Attending: Certified Nurse Midwife | Admitting: Certified Nurse Midwife

## 2022-01-12 ENCOUNTER — Encounter: Payer: Self-pay | Admitting: Radiology

## 2022-01-12 ENCOUNTER — Other Ambulatory Visit: Payer: Self-pay

## 2022-01-12 ENCOUNTER — Ambulatory Visit (INDEPENDENT_AMBULATORY_CARE_PROVIDER_SITE_OTHER): Payer: Commercial Managed Care - HMO

## 2022-01-12 DIAGNOSIS — N926 Irregular menstruation, unspecified: Secondary | ICD-10-CM | POA: Diagnosis not present

## 2022-01-12 NOTE — Progress Notes (Signed)
Beta HCG Follow-up Visit  Debbie Yoder presents to [facility] for follow-up beta HCG lab after TAB. Patient reports continued spotting. No concerns today. Reviewed results will be reviewed by provider and patient contacted via MyChart.  Marjo Bicker 01/12/2022 9:19 AM

## 2022-01-12 NOTE — Progress Notes (Signed)
Per patient, positive pregnancy test at home two nights ago. IAB on 11/21. Per discussion with Marylynn Pearson RN, will keep as Pelvic US for now. Patient to have bhcg drawn this morning at [facility] and they will schedule another [place] if they feel that it is warranted.

## 2022-01-13 LAB — BETA HCG QUANT (REF LAB): hCG Quant: 9 m[IU]/mL

## 2022-01-20 ENCOUNTER — Encounter: Payer: Self-pay | Admitting: Certified Nurse Midwife

## 2022-01-24 ENCOUNTER — Ambulatory Visit (INDEPENDENT_AMBULATORY_CARE_PROVIDER_SITE_OTHER): Payer: Medicaid Other | Admitting: Clinical

## 2022-01-24 DIAGNOSIS — F330 Major depressive disorder, recurrent, mild: Secondary | ICD-10-CM

## 2022-01-24 NOTE — Progress Notes (Signed)
   THERAPIST PROGRESS NOTE  Session Time: 45 minutes  Participation Level: Active  Behavioral Response: CasualAlertAnxious  Type of Therapy: Individual Therapy  Treatment Goals addressed: client will complete at least 80% of assigned homework  ProgressTowards Goals: Progressing  Interventions: CBT and Supportive  Summary:  Debbie Yoder is a 27 y.o. female who presents for the scheduled appointment oriented times five, appropriately dressed, and friendly. Client denied hallucinations. Client reported on today she is doing okay. 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 Client reported has one class two times per week. Client reported she has been trying to be optimistic about completing graduate school.  Client reported in a few days she will be leaving to go to [place] for a week with her advisor.  Client reported she will be doing 1 presentation during the week and is not sure why her advisor wants him to stay that long as it will be cutting into the days of the school semester.  Client reported otherwise she had an okay Christmas she went home to see her family.  Client reported trying to communicate with her mom is always a conflict because her mother has a negative attitude about most things.  Client reported when thinking about her future or current job she would want it has been hard for her to find interest.  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 Evidence of progress towards goal: Client reported 1 of her goal for 2024 is to work on the stress management.  Suicidal/Homicidal: Nowithout intent/plan  Therapist Response:  Therapist began the appointment asking the client how she has been doing since last seen. Therapist used CBT to engage using active listening and positive emotional support. Therapist used CBT to give the client time to discuss her thoughts and feelings  related to stressors in her interpersonal relationships and with school. Therapist used CBT to normalize clients emotional response within reason. Therapist used CBT to discuss stress management techniques. Therapist used CBT ask the client to identify her progress with frequency of use with coping skills with continued practice in her daily activity.    Therapist assigned to client homework to practice self-care.    Plan: Return again in 3 weeks.  Diagnosis: MDD, recurrent episode, mild with anxious distress  Collaboration of Care: Patient refused AEB none requested by the client.  Patient/Guardian was advised Release of Information must be obtained prior to any record release in order to collaborate their care with an outside provider. Patient/Guardian was advised if they have not already done so to contact the registration department to sign all necessary forms in order for [place] to release information regarding their care.   Consent: Patient/Guardian gives verbal consent for treatment and assignment of benefits for services provided during this visit. Patient/Guardian expressed understanding and agreed to proceed.   [place], LCSW 01/24/2022

## 2022-02-13 ENCOUNTER — Ambulatory Visit (HOSPITAL_COMMUNITY): Payer: Medicaid Other | Admitting: Clinical

## 2022-02-27 ENCOUNTER — Ambulatory Visit (INDEPENDENT_AMBULATORY_CARE_PROVIDER_SITE_OTHER): Payer: Medicaid Other | Admitting: Clinical

## 2022-02-27 DIAGNOSIS — F330 Major depressive disorder, recurrent, mild: Secondary | ICD-10-CM

## 2022-02-27 NOTE — Progress Notes (Signed)
   THERAPIST PROGRESS NOTE  Session Time: 45 minutes  Participation Level: Active  Behavioral Response: CasualAlertEuthymic  Type of Therapy: Individual Therapy  Treatment Goals addressed: client will score less than a 10 on the PHQ-9  ProgressTowards Goals: Progressing  Interventions: CBT and Supportive  Summary:  Debbie Yoder is a 27 y.o. female who presents for the scheduled appointment oriented times five, appropriately dressed, and friendly. Client denied hallucinations and delusions. Client reported she had a good birthday. Client reported she contracted COVID but is feeling better. Client reported otherwise she went to [place] with her advisor. Client reported her boyfriend did not go on the trip which upset her. Client reported she presented her project while there. Client reported she has been annoyed because her advisor is still mentioning her graduating late because he wants her to do additional research and papers. Client reported the assignments were not mandatory but she has to do them. Client reported she also has had some issue with friends. Client reported one of her friendships seems to be a trauma bond because they connect by complaining about things to each other. Client reported some and/or most of her friendships seem to be one sided when they don't put in as much effort. Client reported in other areas of her life she feels misunderstood by other people meaning people try to "handle" her. Client reported at work other people treat her vocalizing concerns and/or behaviors as if she is overreacting. Client reported she feels like she has a hard time finding a group of people that she can relate to.  Evidence of progress towards goal:  client PHQ-9 score is a 5.  Flowsheet Row Counselor from 02/27/2022 in [facility]  PHQ-9 Total Score 5         Suicidal/Homicidal: Nowithout intent/plan  Therapist Response:  Therapist began the  appointment asking the client how she has been doing. Therapist used CBT to engage using active listening and positive emotional support. Therapist used CBT to engage and ask the client open ended questions pertaining to stressors with school, social and family life. Therapist used CBT collaborate with the client and normalize her emotional responses within reason and talk through identifying negative cognitions. Therapist completed updated SDOH. Therapist used CBT ask the client to identify her progress with frequency of use with coping skills with continued practice in her daily activity.   Therapist assigned the client homework to practice self care.    Plan: Return again in 3 weeks.  Diagnosis: MDD, recurrent episode, mild ,with anxious distress  Collaboration of Care: Patient refused AEB none requested by the client.  Patient/Guardian was advised Release of Information must be obtained prior to any record release in order to collaborate their care with an outside provider. Patient/Guardian was advised if they have not already done so to contact the registration department to sign all necessary forms in order for [place] to release information regarding their care.   Consent: Patient/Guardian gives verbal consent for treatment and assignment of benefits for services provided during this visit. Patient/Guardian expressed understanding and agreed to proceed.   [place], LCSW 02/27/2022

## 2022-03-28 ENCOUNTER — Ambulatory Visit (INDEPENDENT_AMBULATORY_CARE_PROVIDER_SITE_OTHER): Payer: Medicaid Other | Admitting: Clinical

## 2022-03-28 DIAGNOSIS — F330 Major depressive disorder, recurrent, mild: Secondary | ICD-10-CM

## 2022-04-01 NOTE — Progress Notes (Signed)
   THERAPIST PROGRESS NOTE  Session Time: 45 minutes  Participation Level: Active  Behavioral Response: CasualAlertAnxious  Type of Therapy: Individual Therapy  Treatment Goals addressed: client will participate in at least 80% of scheduled individual psychotherapy sessions  ProgressTowards Goals: Progressing  Interventions: CBT  Summary:  Debbie Yoder is a 27 y.o. female who presents for the scheduled appointment oriented times five, appropriately dressed, and friendly. Client denied hallucinations and delusions. Client reported on today she has been feeling pretty okay. 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 Client reported as usual it is stressful having to work with I'm due to his unprofessionalism. Client reported she has found out that others in the school department share the same sentiment about her advisor. Client reported she is happy because her time to present her final project is coming up and she will be done with school. Client reported work has been going okay. Client reported she has been thinking about her future and where she will apply for work. Client reported she does have a habit of excessive worry of worse case scenario. Client reported she keeps her worries to herself. Client reported she knows it is a unhelpful way of thinking and it is not based on facts. Evidence of progress towards goal:  client reported 1 problem of excessive worry at least 4 days out of 7 that contributes to anxiety.   Suicidal/Homicidal: Nowithout intent/plan  Therapist Response:  Therapist began the appointment asking the client how she has been doing since last seen. Therapist used CBT to engage using active listening and positive emotional support. Therapist used CBT to give the client time to discuss her thoughts. Therapist used CBT to normalize her emotions. Therapist used CBT ask the client to identify her progress with frequency of use with  coping skills with continued practice in her daily activity.    Therapist assigned the client homework to practice self care.   Plan: Return again in 4 weeks.  Diagnosis: major depressive disorder, recurrent episode, mild with anxious distress  Collaboration of Care: Patient refused AEB none requested by the client.  Patient/Guardian was advised Release of Information must be obtained prior to any record release in order to collaborate their care with an outside provider. Patient/Guardian was advised if they have not already done so to contact the registration department to sign all necessary forms in order for [place] to release information regarding their care.   Consent: Patient/Guardian gives verbal consent for treatment and assignment of benefits for services provided during this visit. Patient/Guardian expressed understanding and agreed to proceed.   [place], LCSW 03/28/2022

## 2022-04-25 ENCOUNTER — Ambulatory Visit (INDEPENDENT_AMBULATORY_CARE_PROVIDER_SITE_OTHER): Payer: Medicaid Other | Admitting: Clinical

## 2022-04-25 DIAGNOSIS — F330 Major depressive disorder, recurrent, mild: Secondary | ICD-10-CM | POA: Diagnosis not present

## 2022-04-25 NOTE — Progress Notes (Signed)
   THERAPIST PROGRESS NOTE  Session Time: 60 minutes  Participation Level: Active  Behavioral Response: CasualAlertEuthymic  Type of Therapy: Individual Therapy  Treatment Goals addressed: client will complete at least 80% of assigned homework  ProgressTowards Goals: Progressing  Interventions: CBT and Supportive  Summary:  Debbie Yoder is a 27 y.o. female who presents for the scheduled appointment oriented times five, appropriately dressed, and friendly. Client denied hallucinations and delusions. Client reported on today she is doing good. Client reported she defended her thesis and passed. Client reported she will be graduating in 38 days. Client reported she is going on a trip to [place] for school to present her thesis at a conference. Client reported she is happy that she met 2 girls in her housing complex. Client reported they hung out a few times and that went good. Client reported she is happy that she is getting back on track with being physically active. Client reported she is going to the gym a few times per week. Client reported she has been having stress related to work and thinking about her future for where she wants to work. Client reported otherwise she has been having a good mood. 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 Evidence of progress towards goal:     Suicidal/Homicidal: Nowithout intent/plan  Therapist Response:  Therapist began the appointment asking the client how she has been doing since last seen. Therapist used CBT to engage using active listening and positive emotional support. Therapist used CBT to positively reinforce the client progress with school. Therapist used CBT to reinforce the client utilizing positive outlets to help her maintain a positive mood. Therapist used CBT to discuss continuing to pursue her small future goals. Therapist used CBT ask the client to identify her progress with  frequency of use with coping skills with continued practice in her daily activity.    Therapist assigned the client homework to practice self care.    Plan: Return again in 3 weeks.  Diagnosis: major depressive disorder, recurrent episode, mild, anxious distress   Collaboration of Care: Patient refused AEB none requested by the client.  Patient/Guardian was advised Release of Information must be obtained prior to any record release in order to collaborate their care with an outside provider. Patient/Guardian was advised if they have not already done so to contact the registration department to sign all necessary forms in order for [place] to release information regarding their care.   Consent: Patient/Guardian gives verbal consent for treatment and assignment of benefits for services provided during this visit. Patient/Guardian expressed understanding and agreed to proceed.   [place], LCSW 04/25/2022

## 2022-05-29 ENCOUNTER — Ambulatory Visit (INDEPENDENT_AMBULATORY_CARE_PROVIDER_SITE_OTHER): Payer: Medicaid Other | Admitting: Clinical

## 2022-05-29 DIAGNOSIS — F330 Major depressive disorder, recurrent, mild: Secondary | ICD-10-CM | POA: Diagnosis not present

## 2022-05-29 NOTE — Progress Notes (Signed)
   THERAPIST PROGRESS NOTE  Session Time: 45 minutes  Participation Level: Active  Behavioral Response: CasualAlertEuthymic  Type of Therapy: Individual Therapy  Treatment Goals addressed: client will participate in at least 80% of scheduled individual psychotherapy sessions  ProgressTowards Goals: Progressing  Interventions: CBT and Supportive  Summary:  Kilyn Burdett is a 27 y.o. female who presents for the scheduled appointment oriented times five, appropriately dressed and friendly. Client denied hallucinations and delusions. Client reported she went to the conference in [place] to showcase her research. Client reported it was a good experience but had a negative encounter with a supervisor on the committee. Client reported otherwise she is excited because she is graduating this coming Friday. Client reported  she is getting her accessories together such as her cap and tassel design. 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 Client reported ultimately they are not giving her full time hours on the schedule. Client reported she is going to look for other jobs to make more money. Client reported she will not be having a graduation party due to lack of family and friends are also graduating. Client reported she does not have money to treat herself to something nice for finishing school. Client reported it reminds her of undergrad because she was not able to do anything. Client reported she is also looking to start other activities such as tennis and swimming.   Suicidal/Homicidal: Nowithout intent/plan  Therapist Response:  Therapist began the appointment asking the client how she has been doing. Therapist used CBT to engage using active listening and positive emotional support. Therapist used CBT to engage and give the client time to discuss her thoughts on school, family, friends and work. Therapist used CBT to positively reinforce optimism about her  future. Therapist used CBT ask the client to identify her progress with frequency of use with coping skills with continued practice in her daily activity.         Plan: Return again in 4 weeks.  Diagnosis: major depressive disorder, recurrent episode, mild with anxious distress  Collaboration of Care: Patient refused AEB none requested by the client.  Patient/Guardian was advised Release of Information must be obtained prior to any record release in order to collaborate their care with an outside provider. Patient/Guardian was advised if they have not already done so to contact the registration department to sign all necessary forms in order for [place] to release information regarding their care.   Consent: Patient/Guardian gives verbal consent for treatment and assignment of benefits for services provided during this visit. Patient/Guardian expressed understanding and agreed to proceed.   Neena Rhymes Caitlin [place], LCSW 05/29/2022

## 2022-07-11 ENCOUNTER — Ambulatory Visit (INDEPENDENT_AMBULATORY_CARE_PROVIDER_SITE_OTHER): Payer: Medicaid Other | Admitting: Clinical

## 2022-07-11 DIAGNOSIS — F330 Major depressive disorder, recurrent, mild: Secondary | ICD-10-CM

## 2022-07-11 NOTE — Progress Notes (Signed)
   THERAPIST PROGRESS NOTE  Session Time: 45 minutes  Participation Level: Active  Behavioral Response: CasualAlertEuthymic  Type of Therapy: Individual Therapy  Treatment Goals addressed: client will participate in at least 80% of scheduled individual psychotherapy sessions  ProgressTowards Goals: Progressing  Interventions: CBT and Supportive  Summary:  Debbie Yoder is a 27 y.o. female who presents for the scheduled appointment oriented times five, appropriately dressed and ,friendly. Client denied hallucinations and delusions. Client reported she is doing well. Client reported she is happy she is graduated from her masters program. 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 Client reported she met a new female friend on social media and making connections with other people as well. 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 Client reported she will be moving to temporarily stay with her sister until she finds a place by the end of the year. Client reported overall she is happy with her profession of choice but has to find another location to work in. Client reported she has been staying active going to the gym and exercise. Evidence of progress towards goal:  client reported 1 goal accomplishment of graduating from graduate school.  Suicidal/Homicidal: Nowithout intent/plan  Therapist Response:  Therapist began the appointment asking her how she has been. Therapist used CBT to engage using active listening and positive emotional support. Therapist used CBT to positively reinforce her accomplishment with school. Therapist used CBT to discuss conflict resolution and self care. Therapist used CBT ask the client to identify her progress with frequency of use with coping skills with continued practice in her daily activity.    Therapist assigned the client homework to practice self  care.    Plan: Return again in 4 weeks.  Diagnosis: major depressive disorder, recurrent episode, mild with anxious distress  Collaboration of Care: Patient refused AEB none requested by the client.  Patient/Guardian was advised Release of Information must be obtained prior to any record release in order to collaborate their care with an outside provider. Patient/Guardian was advised if they have not already done so to contact the registration department to sign all necessary forms in order for [place] to release information regarding their care.   Consent: Patient/Guardian gives verbal consent for treatment and assignment of benefits for services provided during this visit. Patient/Guardian expressed understanding and agreed to proceed.   Neena Rhymes [employer], LCSW 07/11/2022

## 2022-08-31 ENCOUNTER — Ambulatory Visit (HOSPITAL_COMMUNITY): Payer: Medicaid Other | Admitting: Clinical

## 2022-09-28 ENCOUNTER — Ambulatory Visit (HOSPITAL_COMMUNITY): Payer: Medicaid Other | Admitting: Clinical
